# Patient Record
Sex: Female | Born: 1965 | Race: White | Hispanic: No | Smoking: Former smoker
Health system: Southern US, Community
[De-identification: ages and names within clinical notes are randomized; demographics above are authoritative.]

## PROBLEM LIST (undated history)

## (undated) DIAGNOSIS — R591 Generalized enlarged lymph nodes: Secondary | ICD-10-CM

## (undated) DIAGNOSIS — Z8701 Personal history of pneumonia (recurrent): Secondary | ICD-10-CM

## (undated) DIAGNOSIS — D72829 Elevated white blood cell count, unspecified: Secondary | ICD-10-CM

## (undated) HISTORY — DX: Elevated white blood cell count, unspecified: D72.829

## (undated) HISTORY — DX: Generalized enlarged lymph nodes: R59.1

## (undated) HISTORY — PX: OTHER SURGICAL HISTORY: SHX169

## (undated) HISTORY — DX: Personal history of pneumonia (recurrent): Z87.01

---

## 2016-08-17 ENCOUNTER — Emergency Department: Payer: Self-pay

## 2016-08-17 ENCOUNTER — Encounter: Payer: Self-pay | Admitting: Radiology

## 2016-08-17 ENCOUNTER — Inpatient Hospital Stay
Admission: EM | Admit: 2016-08-17 | Discharge: 2016-08-21 | DRG: 853 | Disposition: A | Discharge status: Home / Self Care | Payer: Self-pay | Attending: Internal Medicine | Admitting: Internal Medicine

## 2016-08-17 DIAGNOSIS — D4989 Neoplasm of unspecified behavior of other specified sites: Secondary | ICD-10-CM

## 2016-08-17 DIAGNOSIS — J154 Pneumonia due to other streptococci: Secondary | ICD-10-CM | POA: Diagnosis present

## 2016-08-17 DIAGNOSIS — C8590 Non-Hodgkin lymphoma, unspecified, unspecified site: Secondary | ICD-10-CM | POA: Diagnosis present

## 2016-08-17 DIAGNOSIS — R945 Abnormal results of liver function studies: Secondary | ICD-10-CM | POA: Diagnosis present

## 2016-08-17 DIAGNOSIS — A419 Sepsis, unspecified organism: Principal | ICD-10-CM | POA: Diagnosis present

## 2016-08-17 DIAGNOSIS — R590 Localized enlarged lymph nodes: Secondary | ICD-10-CM | POA: Diagnosis present

## 2016-08-17 DIAGNOSIS — Z87891 Personal history of nicotine dependence: Secondary | ICD-10-CM

## 2016-08-17 DIAGNOSIS — R197 Diarrhea, unspecified: Secondary | ICD-10-CM | POA: Diagnosis present

## 2016-08-17 DIAGNOSIS — E876 Hypokalemia: Secondary | ICD-10-CM | POA: Diagnosis present

## 2016-08-17 DIAGNOSIS — F419 Anxiety disorder, unspecified: Secondary | ICD-10-CM | POA: Diagnosis present

## 2016-08-17 DIAGNOSIS — J181 Lobar pneumonia, unspecified organism: Secondary | ICD-10-CM

## 2016-08-17 DIAGNOSIS — R591 Generalized enlarged lymph nodes: Secondary | ICD-10-CM

## 2016-08-17 DIAGNOSIS — J189 Pneumonia, unspecified organism: Secondary | ICD-10-CM | POA: Diagnosis present

## 2016-08-17 DIAGNOSIS — Z8249 Family history of ischemic heart disease and other diseases of the circulatory system: Secondary | ICD-10-CM

## 2016-08-17 LAB — LACTIC ACID, PLASMA: Lactic Acid, Venous: 1.1 mmol/L (ref 0.5–1.9)

## 2016-08-17 LAB — URINALYSIS, COMPLETE (UACMP) WITH MICROSCOPIC
Bilirubin Urine: NEGATIVE
Glucose, UA: NEGATIVE mg/dL
Hgb urine dipstick: NEGATIVE
KETONES UR: NEGATIVE mg/dL
LEUKOCYTES UA: NEGATIVE
Nitrite: NEGATIVE
PH: 5 (ref 5.0–8.0)
PROTEIN: NEGATIVE mg/dL
Specific Gravity, Urine: 1.01 (ref 1.005–1.030)

## 2016-08-17 LAB — CBC WITH DIFFERENTIAL/PLATELET
BASOS PCT: 0 %
Basophils Absolute: 0.1 10*3/uL (ref 0–0.1)
EOS ABS: 0.1 10*3/uL (ref 0–0.7)
Eosinophils Relative: 0 %
HCT: 34.8 % — ABNORMAL LOW (ref 35.0–47.0)
HEMOGLOBIN: 11.5 g/dL — AB (ref 12.0–16.0)
LYMPHS ABS: 11.3 10*3/uL — AB (ref 1.0–3.6)
Lymphocytes Relative: 35 %
MCH: 28.9 pg (ref 26.0–34.0)
MCHC: 33 g/dL (ref 32.0–36.0)
MCV: 87.5 fL (ref 80.0–100.0)
MONO ABS: 1.5 10*3/uL — AB (ref 0.2–0.9)
MONOS PCT: 5 %
Neutro Abs: 19.4 10*3/uL — ABNORMAL HIGH (ref 1.4–6.5)
Neutrophils Relative %: 60 %
Platelets: 362 10*3/uL (ref 150–440)
RBC: 3.98 MIL/uL (ref 3.80–5.20)
RDW: 13.5 % (ref 11.5–14.5)
WBC: 32.4 10*3/uL — ABNORMAL HIGH (ref 3.6–11.0)

## 2016-08-17 LAB — COMPREHENSIVE METABOLIC PANEL
ALBUMIN: 2.5 g/dL — AB (ref 3.5–5.0)
ALK PHOS: 140 U/L — AB (ref 38–126)
ALT: 66 U/L — AB (ref 14–54)
ANION GAP: 12 (ref 5–15)
AST: 59 U/L — ABNORMAL HIGH (ref 15–41)
BUN: 11 mg/dL (ref 6–20)
CALCIUM: 8.7 mg/dL — AB (ref 8.9–10.3)
CO2: 23 mmol/L (ref 22–32)
CREATININE: 0.71 mg/dL (ref 0.44–1.00)
Chloride: 100 mmol/L — ABNORMAL LOW (ref 101–111)
GFR calc Af Amer: >60 mL/min (ref >60)
GFR calc non Af Amer: >60 mL/min (ref >60)
GLUCOSE: 126 mg/dL — AB (ref 65–99)
Potassium: 2.8 mmol/L — ABNORMAL LOW (ref 3.5–5.1)
Sodium: 135 mmol/L (ref 135–145)
TOTAL PROTEIN: 6.9 g/dL (ref 6.5–8.1)
Total Bilirubin: 0.6 mg/dL (ref 0.3–1.2)

## 2016-08-17 LAB — PROTIME-INR
INR: 1.13
PROTHROMBIN TIME: 14.6 s (ref 11.4–15.2)

## 2016-08-17 MED ORDER — DEXTROSE 5 % IV SOLN
500.0000 mg | INTRAVENOUS | Status: DC
  Administered 2016-08-18 – 2016-08-19 (×2): 500 mg via INTRAVENOUS
  Filled 2016-08-17 (×3): qty 500

## 2016-08-17 MED ORDER — ONDANSETRON HCL 4 MG PO TABS: 4.0000 mg | ORAL_TABLET | Freq: Four times a day (QID) | ORAL | Status: DC | PRN

## 2016-08-17 MED ORDER — ACETAMINOPHEN 650 MG RE SUPP: 650.0000 mg | Freq: Four times a day (QID) | RECTAL | Status: DC | PRN

## 2016-08-17 MED ORDER — SENNOSIDES-DOCUSATE SODIUM 8.6-50 MG PO TABS: 1.0000 | ORAL_TABLET | Freq: Every evening | ORAL | Status: DC | PRN

## 2016-08-17 MED ORDER — CEFTRIAXONE SODIUM-DEXTROSE 1-3.74 GM-% IV SOLR
1.0000 g | INTRAVENOUS | Status: DC
  Administered 2016-08-18: 1 g via INTRAVENOUS
  Filled 2016-08-17: qty 50

## 2016-08-17 MED ORDER — SODIUM CHLORIDE 0.9% FLUSH
3.0000 mL | Freq: Two times a day (BID) | INTRAVENOUS | Status: DC
  Administered 2016-08-18 – 2016-08-20 (×3): 3 mL via INTRAVENOUS

## 2016-08-17 MED ORDER — IOPAMIDOL (ISOVUE-300) INJECTION 61%
100.0000 mL | Freq: Once | INTRAVENOUS | Status: AC | PRN
  Administered 2016-08-17: 100 mL via INTRAVENOUS

## 2016-08-17 MED ORDER — PIPERACILLIN-TAZOBACTAM 3.375 G IVPB 30 MIN
3.3750 g | Freq: Once | INTRAVENOUS | Status: AC
  Administered 2016-08-17: 3.375 g via INTRAVENOUS
  Filled 2016-08-17: qty 50

## 2016-08-17 MED ORDER — DEXTROSE 5 % IV SOLN
500.0000 mg | Freq: Once | INTRAVENOUS | Status: AC
  Administered 2016-08-17: 500 mg via INTRAVENOUS
  Filled 2016-08-17: qty 500

## 2016-08-17 MED ORDER — ENOXAPARIN SODIUM 40 MG/0.4ML ~~LOC~~ SOLN
40.0000 mg | SUBCUTANEOUS | Status: DC
  Administered 2016-08-18: 40 mg via SUBCUTANEOUS
  Filled 2016-08-17: qty 0.4

## 2016-08-17 MED ORDER — ONDANSETRON HCL 4 MG/2ML IJ SOLN: 4.0000 mg | Freq: Four times a day (QID) | INTRAMUSCULAR | Status: DC | PRN

## 2016-08-17 MED ORDER — IOPAMIDOL (ISOVUE-300) INJECTION 61%
30.0000 mL | Freq: Once | INTRAVENOUS | Status: AC | PRN
  Administered 2016-08-17: 30 mL via ORAL

## 2016-08-17 MED ORDER — ONDANSETRON HCL 4 MG/2ML IJ SOLN
4.0000 mg | Freq: Once | INTRAMUSCULAR | Status: AC
  Administered 2016-08-17: 4 mg via INTRAVENOUS
  Filled 2016-08-17: qty 2

## 2016-08-17 MED ORDER — SODIUM CHLORIDE 0.9 % IV BOLUS (SEPSIS)
1000.0000 mL | Freq: Once | INTRAVENOUS | Status: AC
  Administered 2016-08-17: 1000 mL via INTRAVENOUS

## 2016-08-17 MED ORDER — ACETAMINOPHEN 325 MG PO TABS
650.0000 mg | ORAL_TABLET | Freq: Four times a day (QID) | ORAL | Status: DC | PRN
  Administered 2016-08-18: 650 mg via ORAL
  Filled 2016-08-17: qty 2

## 2016-08-17 MED ORDER — POTASSIUM CHLORIDE IN NACL 20-0.9 MEQ/L-% IV SOLN
INTRAVENOUS | Status: DC
  Administered 2016-08-18 – 2016-08-21 (×7): via INTRAVENOUS
  Filled 2016-08-17 (×9): qty 1000

## 2016-08-17 MED ORDER — SODIUM CHLORIDE 0.9 % IV BOLUS (SEPSIS)
2000.0000 mL | Freq: Once | INTRAVENOUS | Status: AC
  Administered 2016-08-17: 2000 mL via INTRAVENOUS

## 2016-08-17 MED ORDER — VANCOMYCIN HCL IN DEXTROSE 1-5 GM/200ML-% IV SOLN
1000.0000 mg | Freq: Once | INTRAVENOUS | Status: AC
  Administered 2016-08-17: 1000 mg via INTRAVENOUS
  Filled 2016-08-17: qty 200

## 2016-08-17 NOTE — H&P (Signed)
[facility] [place] NAME: Sheryl Mitchell    MR#:  [id number]  DATE OF BIRTH:  [birth year]  DATE OF ADMISSION:  08/17/2016  PRIMARY CARE PHYSICIAN: No PCP Per Patient   REQUESTING/REFERRING PHYSICIAN:   CHIEF COMPLAINT:   Chief Complaint  Patient presents with  . Fever    HISTORY OF PRESENT ILLNESS: Sheryl Mitchell  is a 50 y.o. female with no past medical history history presented to the emergency room with fever for the last couple of days. She also has some cough is productive of phlegm. No complaints of any chest pain, shortness of breath. She also had an episode of diarrhea. Patient was evaluated in the emergency room she was found to have pneumonia on x-ray and also she was worked up with CT abdomen because as she had elevated RBC count. Extensive lymphadenopathy noted on the CT abdomen. Hospitalist service was consulted for the care of the patient. No recent travel or sick contacts at home. No headache, dizziness and blurry vision. Patient says she has a lot of weakness and fatigue and also decreased appetite.   PAST MEDICAL HISTORY:  History reviewed. No pertinent past medical history.  PAST SURGICAL HISTORY: Past Surgical History:  Procedure Laterality Date  . none      SOCIAL HISTORY:  Social History  Substance Use Topics  . Smoking status: Former Research scientist (life sciences)  . Smokeless tobacco: Never Used  . Alcohol use 0.6 oz/week    1 Glasses of wine per week    FAMILY HISTORY:  Family History  Problem Relation Age of Onset  . Heart disease Mother   . Cancer Father     DRUG ALLERGIES: No Known Allergies  REVIEW OF SYSTEMS:   CONSTITUTIONAL: Has fever, fatigue and weakness.  EYES: No blurred or double vision.  EARS, NOSE, AND THROAT: No tinnitus or ear pain.  RESPIRATORY: No cough, shortness of breath, wheezing or hemoptysis.  CARDIOVASCULAR: No chest pain, orthopnea, edema.  GASTROINTESTINAL: No nausea, vomiting, one  episode of diarrhea ,mild abdominal pain.  GENITOURINARY: No dysuria, hematuria.  ENDOCRINE: No polyuria, nocturia,  HEMATOLOGY: No anemia, easy bruising or bleeding SKIN: No rash or lesion. MUSCULOSKELETAL: No joint pain or arthritis.   NEUROLOGIC: No tingling, numbness, weakness.  PSYCHIATRY: No anxiety or depression.   MEDICATIONS AT HOME:  Prior to Admission medications   Not on File      PHYSICAL EXAMINATION:   VITAL SIGNS: Blood pressure 116/78, pulse 99, temperature 98.6 F (37 C), temperature source Oral, resp. rate (!) 24, height 5\' 4"  (1.626 m), weight 88.5 kg (195 lb), SpO2 97 %.  GENERAL:  51 y.o.-year-old patient lying in the bed with no acute distress.  EYES: Pupils equal, round, reactive to light and accommodation. No scleral icterus. Extraocular muscles intact.  HEENT: Head atraumatic, normocephalic. Oropharynx and nasopharynx clear.  NECK:  Supple, no jugular venous distention. No thyroid enlargement, no tenderness.  LUNGS: Normal breath sounds bilaterally, no wheezing, rales,rhonchi or crepitation. No use of accessory muscles of respiration.  CARDIOVASCULAR: S1, S2 normal. No murmurs, rubs, or gallops.  ABDOMEN: Soft, mild tenderness around umbilicus, nondistended. Bowel sounds present. No organomegaly or mass.  EXTREMITIES: No pedal edema, cyanosis, or clubbing.  NEUROLOGIC: Cranial nerves II through XII are intact. Muscle strength 5/5 in all extremities. Sensation intact. Gait not checked.  PSYCHIATRIC: The patient is alert and oriented x 3.  SKIN: No obvious rash, lesion, or ulcer.   LABORATORY PANEL:  CBC  Recent Labs Lab 08/17/16 1822  WBC 32.4*  HGB 11.5*  HCT 34.8*  PLT 362  MCV 87.5  MCH 28.9  MCHC 33.0  RDW 13.5  LYMPHSABS 11.3*  MONOABS 1.5*  EOSABS 0.1  BASOSABS 0.1   ------------------------------------------------------------------------------------------------------------------  Chemistries   Recent Labs Lab 08/17/16 1822  NA  135  K 2.8*  CL 100*  CO2 23  GLUCOSE 126*  BUN 11  CREATININE 0.71  CALCIUM 8.7*  AST 59*  ALT 66*  ALKPHOS 140*  BILITOT 0.6   ------------------------------------------------------------------------------------------------------------------ estimated creatinine clearance is 87.6 mL/min (by C-G formula based on SCr of 0.71 mg/dL). ------------------------------------------------------------------------------------------------------------------ No results for input(s): TSH, T4TOTAL, T3FREE, THYROIDAB in the last 72 hours.  Invalid input(s): FREET3   Coagulation profile  Recent Labs Lab 08/17/16 1822  INR 1.13   ------------------------------------------------------------------------------------------------------------------- No results for input(s): DDIMER in the last 72 hours. -------------------------------------------------------------------------------------------------------------------  Cardiac Enzymes No results for input(s): CKMB, TROPONINI, MYOGLOBIN in the last 168 hours.  Invalid input(s): CK ------------------------------------------------------------------------------------------------------------------ Invalid input(s): POCBNP  ---------------------------------------------------------------------------------------------------------------  Urinalysis    Component Value Date/Time   COLORURINE YELLOW (A) 08/17/2016 1822   APPEARANCEUR CLEAR (A) 08/17/2016 1822   LABSPEC 1.010 08/17/2016 1822   PHURINE 5.0 08/17/2016 1822   GLUCOSEU NEGATIVE 08/17/2016 1822   HGBUR NEGATIVE 08/17/2016 1822   BILIRUBINUR NEGATIVE 08/17/2016 1822   KETONESUR NEGATIVE 08/17/2016 1822   PROTEINUR NEGATIVE 08/17/2016 1822   NITRITE NEGATIVE 08/17/2016 1822   LEUKOCYTESUR NEGATIVE 08/17/2016 1822     RADIOLOGY: Dg Chest 2 View  Result Date: 08/17/2016 CLINICAL DATA:  Fever and cough EXAM: CHEST  2 VIEW COMPARISON:  None. FINDINGS: Cardiac shadow is within normal  limits. Right upper lobe infiltrate is seen. The lungs are otherwise clear. No pneumothorax or effusion is noted. No bony abnormality is seen. IMPRESSION: Right upper lobe pneumonia. Followup PA and lateral chest X-ray is recommended in 3-4 weeks following trial of antibiotic therapy to ensure resolution and exclude underlying malignancy. Electronically Signed   By: Inez Catalina M.D.   On: 08/17/2016 19:24   Ct Abdomen Pelvis W Contrast  Result Date: 08/17/2016 CLINICAL DATA:  Fever, diarrhea, fatigue and anorexia for 6 days. EXAM: CT ABDOMEN AND PELVIS WITH CONTRAST TECHNIQUE: Multidetector CT imaging of the abdomen and pelvis was performed using the standard protocol following bolus administration of intravenous contrast. CONTRAST:  171mL ISOVUE-300 IOPAMIDOL (ISOVUE-300) INJECTION 61% COMPARISON:  None. FINDINGS: Lower chest: Consolidation in the right middle lobe base, continuing above the upper limit of this study. Inferior mediastinal adenopathy measuring at least 2 cm short axis just to the right of the distal thoracic esophagus. Hepatobiliary: No focal liver abnormality is seen. No gallstones, gallbladder wall thickening, or biliary dilatation. Pancreas: Unremarkable. No pancreatic ductal dilatation or surrounding inflammatory changes. Spleen: No focal splenic lesions. The spleen is mildly enlarged, measuring 9.0 by 1.5 x 1.4 cm. Adrenals/Urinary Tract: Adrenal glands are unremarkable. Kidneys are normal, without renal calculi, focal lesion, or hydronephrosis. Bladder is unremarkable. Stomach/Bowel: Stomach is within normal limits. Appendix appears normal. No evidence of bowel wall thickening, distention, or inflammatory changes. Vascular/Lymphatic: The abdominal aorta is normal in caliber with mild atherosclerotic calcification. There is extensive adenopathy, with greatest involvement in the retroperitoneum. There is a 10 mm short axis aortocaval node on series 2, image 32 and a 1.4 cm celiac node to  the left of midline on series 2, image 32. There is a 12 mm retrocaval node on series 2 image 48. There are  multiple para-aortic nodes measuring up to 1.2 cm. There is a left common iliac node measuring 1.3 cm on series 2, image 60. There is a 1.5 cm node adjacent to the left iliac bifurcation on series 2, image 66. There are multiple iliac nodes throughout the pelvis. There are bilateral obturator internus nodes measuring up to 1.6 cm on series 2, image 80. Reproductive: Uterus and bilateral adnexa are unremarkable. Other: No focal inflammation.  No ascites. Musculoskeletal: No acute or significant osseous findings. IMPRESSION: 1. Extensive adenopathy throughout the abdomen and pelvis. This is concerning for a neoplastic process such as lymphoma. 2. Mild splenomegaly.  No focal splenic lesion. 3. Included portions of the lower chest demonstrate inferior mediastinal adenopathy and right middle lobe base lung consolidation. Consider chest CT for evaluation of extent of disease. 4. Consider tissue sampling for diagnosis. If there are no palpable nodes, many of the abdomen/pelvic nodes are amenable to CT-guided biopsy. 5. These results will be called to the ordering clinician or representative by the Radiologist Assistant, and communication documented in the PACS or zVision Dashboard. Electronically Signed   By: Andreas Newport M.D.   On: 08/17/2016 21:28    EKG: No orders found for this or any previous visit.  IMPRESSION AND PLAN: 51 year old female patient with no past medical history presented to the emergency room with fever, abdominal discomfort and cough. Admitting diagnosis 1. Community-acquired pneumonia 2. Extensive abdominal lymphadenopathy 3. Leukocytosis 4. Hypokalemia 5. Abnormal liver function tests Treatment plan Admit patient to medical floor IV fluid hydration Start patient on IV Rocephin and IV Zithromax antibiotic Oncology consultation for evaluation of abdominal  lymphadenopathy Replace potassium intravenously Follow-up WBC count   All the records are reviewed and case discussed with ED provider. Management plans discussed with the patient, family and they are in agreement.  CODE STATUS:FULL CODE    Code Status Orders        Start     Ordered   08/17/16 2313  Full code  Continuous     08/17/16 2312    Code Status History    Date Active Date Inactive Code Status Order ID Comments User Context   This patient has a current code status but no historical code status.       TOTAL TIME TAKING CARE OF THIS PATIENT: 50 minutes.    Saundra Shelling M.D on 08/17/2016 at 11:37 PM  Between 7am to 6pm - Pager - [phone]  After 6pm go to [email or web address] - password [email or web address] [facility]  [facility]  Office  [phone]  CC: Primary care physician; No PCP Per Patient

## 2016-08-17 NOTE — ED Notes (Signed)
Pt transport to 103 

## 2016-08-17 NOTE — ED Provider Notes (Signed)
[facility] Emergency Department Provider Note  ____________________________________________  Time seen: Approximately 7:03 PM  I have reviewed the triage vital signs and the nursing notes.   HISTORY  Chief Complaint Fever    HPI Sheryl Mitchell is a 51 y.o. female who complains of gradual onset mild generalized abdominal discomfort over the past week. This is been constant. No aggravating or alleviating factors. Associated with fever or chills and diarrhea. She has lost her appetite as well and feels like she is probably dehydrated. No dizziness. No chest pain or shortness of breath. Never had anything like this before. A coworker was recently sick with same symptoms this week.   History reviewed. No pertinent past medical history. None  There are no active problems to display for this patient.    No past surgical history on file. None  Prior to Admission medications   Not on File  None   Allergies Patient has no known allergies.   No family history on file.  Social History Social History  Substance Use Topics  . Smoking status: Not on file  . Smokeless tobacco: Not on file  . Alcohol use Not on file  No tobacco or alcohol use  Review of Systems  Constitutional:   Positive fever and chills.  ENT:   No sore throat. No rhinorrhea. Lymphatic: No swollen glands, No extremity swelling Endocrine: No hot/cold flashes. No significant weight change. No neck swelling. Cardiovascular:   No chest pain or syncope. Respiratory:   No dyspnea positive nonproductive cough. Gastrointestinal:   Positive abdominal discomfort, positive diarrhea. No vomiting.  Genitourinary:   Negative for dysuria. Positive for decreased urine output. Musculoskeletal:   Negative for focal pain or swelling Neurological:   Negative for headaches or weakness. All other systems reviewed and are negative except as documented above in ROS and  HPI.  ____________________________________________   PHYSICAL EXAM:  VITAL SIGNS: ED Triage Vitals  Enc Vitals Group     BP 08/17/16 1807 (!) 149/90     Pulse Rate 08/17/16 1807 (!) 127     Resp 08/17/16 1807 20     Temp 08/17/16 1807 (!) 100.5 F (38.1 C)     Temp Source 08/17/16 1807 Oral     SpO2 08/17/16 1807 94 %     Weight 08/17/16 1808 195 lb (88.5 kg)     Height 08/17/16 1808 5\' 4"  (1.626 m)     Head Circumference --      Peak Flow --      Pain Score --      Pain Loc --      Pain Edu? --      Excl. in [place]? --     Vital signs reviewed, nursing assessments reviewed.   Constitutional:   Alert and oriented. Not in distress. Eyes:   No scleral icterus. No conjunctival pallor. PERRL. EOMI.  No nystagmus. ENT   Head:   Normocephalic and atraumatic.   Nose:   No congestion/rhinnorhea. No septal hematoma   Mouth/Throat:   Dry mucous membranes, mild pharyngeal erythema. No peritonsillar mass.    Neck:   No stridor. No SubQ emphysema. No meningismus. Hematological/Lymphatic/Immunilogical:   No cervical lymphadenopathy. Cardiovascular:   Tachycardia heart rate 120. Symmetric bilateral radial and DP pulses.  No murmurs.  Respiratory:   Normal respiratory effort without tachypnea nor retractions. Breath sounds are clear and equal bilaterally. No wheezes/rales/rhonchi. Gastrointestinal:   Soft and nontender. Non distended. There is no CVA tenderness.  No rebound,  rigidity, or guarding. Genitourinary:   deferred Musculoskeletal:   Normal range of motion in all extremities. No joint effusions.  No lower extremity tenderness.  No edema. Neurologic:   Normal speech and language.  CN 2-10 normal. Motor grossly intact. No gross focal neurologic deficits are appreciated.  Skin:    Skin is warm, dry and intact. No rash noted.  No petechiae, purpura, or bullae.  ____________________________________________    LABS (pertinent positives/negatives) (all labs ordered are  listed, but only abnormal results are displayed) Labs Reviewed  COMPREHENSIVE METABOLIC PANEL - Abnormal; Notable for the following:       Result Value   Potassium 2.8 (*)    Chloride 100 (*)    Glucose, Bld 126 (*)    Calcium 8.7 (*)    Albumin 2.5 (*)    AST 59 (*)    ALT 66 (*)    Alkaline Phosphatase 140 (*)    All other components within normal limits  CBC WITH DIFFERENTIAL/PLATELET - Abnormal; Notable for the following:    WBC 32.4 (*)    Hemoglobin 11.5 (*)    HCT 34.8 (*)    Neutro Abs 19.4 (*)    Lymphs Abs 11.3 (*)    Monocytes Absolute 1.5 (*)    All other components within normal limits  URINALYSIS, COMPLETE (UACMP) WITH MICROSCOPIC - Abnormal; Notable for the following:    Color, Urine YELLOW (*)    APPearance CLEAR (*)    Bacteria, UA FEW (*)    Squamous Epithelial / LPF 0-5 (*)    All other components within normal limits  CULTURE, BLOOD (ROUTINE X 2)  CULTURE, BLOOD (ROUTINE X 2)  LACTIC ACID, PLASMA  PROTIME-INR   ____________________________________________   EKG    ____________________________________________    RADIOLOGY  Dg Chest 2 View  Result Date: 08/17/2016 CLINICAL DATA:  Fever and cough EXAM: CHEST  2 VIEW COMPARISON:  None. FINDINGS: Cardiac shadow is within normal limits. Right upper lobe infiltrate is seen. The lungs are otherwise clear. No pneumothorax or effusion is noted. No bony abnormality is seen. IMPRESSION: Right upper lobe pneumonia. Followup PA and lateral chest X-ray is recommended in 3-4 weeks following trial of antibiotic therapy to ensure resolution and exclude underlying malignancy. Electronically Signed   By: Inez Catalina M.D.   On: 08/17/2016 19:24   Ct Abdomen Pelvis W Contrast  Result Date: 08/17/2016 CLINICAL DATA:  Fever, diarrhea, fatigue and anorexia for 6 days. EXAM: CT ABDOMEN AND PELVIS WITH CONTRAST TECHNIQUE: Multidetector CT imaging of the abdomen and pelvis was performed using the standard protocol following  bolus administration of intravenous contrast. CONTRAST:  157mL ISOVUE-300 IOPAMIDOL (ISOVUE-300) INJECTION 61% COMPARISON:  None. FINDINGS: Lower chest: Consolidation in the right middle lobe base, continuing above the upper limit of this study. Inferior mediastinal adenopathy measuring at least 2 cm short axis just to the right of the distal thoracic esophagus. Hepatobiliary: No focal liver abnormality is seen. No gallstones, gallbladder wall thickening, or biliary dilatation. Pancreas: Unremarkable. No pancreatic ductal dilatation or surrounding inflammatory changes. Spleen: No focal splenic lesions. The spleen is mildly enlarged, measuring 9.0 by 1.5 x 1.4 cm. Adrenals/Urinary Tract: Adrenal glands are unremarkable. Kidneys are normal, without renal calculi, focal lesion, or hydronephrosis. Bladder is unremarkable. Stomach/Bowel: Stomach is within normal limits. Appendix appears normal. No evidence of bowel wall thickening, distention, or inflammatory changes. Vascular/Lymphatic: The abdominal aorta is normal in caliber with mild atherosclerotic calcification. There is extensive adenopathy, with greatest involvement in  the retroperitoneum. There is a 10 mm short axis aortocaval node on series 2, image 32 and a 1.4 cm celiac node to the left of midline on series 2, image 32. There is a 12 mm retrocaval node on series 2 image 48. There are multiple para-aortic nodes measuring up to 1.2 cm. There is a left common iliac node measuring 1.3 cm on series 2, image 60. There is a 1.5 cm node adjacent to the left iliac bifurcation on series 2, image 66. There are multiple iliac nodes throughout the pelvis. There are bilateral obturator internus nodes measuring up to 1.6 cm on series 2, image 80. Reproductive: Uterus and bilateral adnexa are unremarkable. Other: No focal inflammation.  No ascites. Musculoskeletal: No acute or significant osseous findings. IMPRESSION: 1. Extensive adenopathy throughout the abdomen and  pelvis. This is concerning for a neoplastic process such as lymphoma. 2. Mild splenomegaly.  No focal splenic lesion. 3. Included portions of the lower chest demonstrate inferior mediastinal adenopathy and right middle lobe base lung consolidation. Consider chest CT for evaluation of extent of disease. 4. Consider tissue sampling for diagnosis. If there are no palpable nodes, many of the abdomen/pelvic nodes are amenable to CT-guided biopsy. 5. These results will be called to the ordering clinician or representative by the Radiologist Assistant, and communication documented in the PACS or zVision Dashboard. Electronically Signed   By: Andreas Newport M.D.   On: 08/17/2016 21:28    ____________________________________________   PROCEDURES Procedures  ____________________________________________   INITIAL IMPRESSION / ASSESSMENT AND PLAN / ED COURSE  Pertinent labs & imaging results that were available during my care of the patient were reviewed by me and considered in my medical decision making (see chart for details).  Patient presents with fever and abdominal discomfort and diarrhea. Found to have fever and tachycardia on vital signs. Exam is unremarkable actually. This may be viral gastroenteritis, will workup with sepsis labs, if any significant abnormalities patient may need imaging of the abdomen to look for any occult pathology that is not clinically apparent.  Clinical Course as of Aug 18 2227  Sun Aug 17, 2016  1903 Check CT a/p. Zosyn.  WBC: (!) 32.4 [PS]  1952 CXR c/w RUL pna. Will broaden abx. Will p lan to admit given severity of presentation.   [PS]  2159 D/w Dr. Rogue Bussing. Diff. Not c/w blast crisis. Appropriate to admit to [facility] for PNA/sepsis tx and heme/onc consult in AM.  [PS]  2227 Along discussion with the patient regarding the findings and concerns for possible malignancy. Discussed with hospitalist for further management.       [PS]    Clinical Course User  Index [PS] Carrie Mew, MD     ----------------------------------------- 10:29 PM on 08/17/2016 -----------------------------------------  Sepsis - Repeat Assessment  Performed at:    10:20 PM  Vitals     Blood pressure (!) 141/80, pulse (!) 109, temperature (!) 100.5 F (38.1 C), temperature source Oral, resp. rate (!) 25, height 5\' 4"  (1.626 m), weight 195 lb (88.5 kg), SpO2 98 %.  Heart:     Regular rate and rhythm  Lungs:    CTA  Capillary Refill:   <2 sec  Peripheral Pulse:   Radial pulse palpable  Skin:     Normal Color     ____________________________________________   FINAL CLINICAL IMPRESSION(S) / ED DIAGNOSES  Final diagnoses:  Pneumonia of right upper lobe due to infectious organism ([place])  Sepsis, due to unspecified organism [facility])  Lymphadenopathy  New Prescriptions   No medications on file     Portions of this note were generated with dragon dictation software. Dictation errors may occur despite best attempts at proofreading.    Carrie Mew, MD 08/17/16 2230

## 2016-08-17 NOTE — Progress Notes (Addendum)
Pharmacy Antibiotic Note  Sheryl Mitchell is a 51 y.o. female admitted on 08/17/2016 with pneumonia.  Pharmacy has been consulted for Ceftriaxone dosing.  Plan: WIll initiate Ceftriaxone 2 g IV daily   Height: 5\' 4"  (162.6 cm) Weight: 195 lb (88.5 kg) IBW/kg (Calculated) : 54.7  Temp (24hrs), Avg:99.7 F (37.6 C), Min:98.6 F (37 C), Max:100.5 F (38.1 C)   Recent Labs Lab 08/17/16 1822  WBC 32.4*  CREATININE 0.71  LATICACIDVEN 1.1    Estimated Creatinine Clearance: 87.6 mL/min (by C-G formula based on SCr of 0.71 mg/dL).    No Known Allergies   Thank you for allowing pharmacy to be a part of this patient's care.  Tobie Lords, PharmD, BCPS Clinical Pharmacist 08/17/2016

## 2016-08-17 NOTE — ED Triage Notes (Signed)
Pt reports that she started feeling bad on Monday, fever, diarrhea, and cough, pt denies pain, pt states that she isn't getting better and feels the worst she's ever felt in her life, denies hx of diverticulitis or pneumonia, denies pain

## 2016-08-18 ENCOUNTER — Inpatient Hospital Stay: Payer: Self-pay

## 2016-08-18 DIAGNOSIS — J189 Pneumonia, unspecified organism: Secondary | ICD-10-CM

## 2016-08-18 DIAGNOSIS — D72829 Elevated white blood cell count, unspecified: Secondary | ICD-10-CM

## 2016-08-18 DIAGNOSIS — R7989 Other specified abnormal findings of blood chemistry: Secondary | ICD-10-CM

## 2016-08-18 DIAGNOSIS — R197 Diarrhea, unspecified: Secondary | ICD-10-CM

## 2016-08-18 DIAGNOSIS — Z79899 Other long term (current) drug therapy: Secondary | ICD-10-CM

## 2016-08-18 DIAGNOSIS — R110 Nausea: Secondary | ICD-10-CM

## 2016-08-18 DIAGNOSIS — R591 Generalized enlarged lymph nodes: Secondary | ICD-10-CM

## 2016-08-18 DIAGNOSIS — Z87891 Personal history of nicotine dependence: Secondary | ICD-10-CM

## 2016-08-18 DIAGNOSIS — R05 Cough: Secondary | ICD-10-CM

## 2016-08-18 LAB — BLOOD CULTURE ID PANEL (REFLEXED)
Acinetobacter baumannii: NOT DETECTED
CANDIDA KRUSEI: NOT DETECTED
CANDIDA PARAPSILOSIS: NOT DETECTED
Candida albicans: NOT DETECTED
Candida glabrata: NOT DETECTED
Candida tropicalis: NOT DETECTED
ESCHERICHIA COLI: NOT DETECTED
Enterobacter cloacae complex: NOT DETECTED
Enterobacteriaceae species: NOT DETECTED
Enterococcus species: NOT DETECTED
Haemophilus influenzae: NOT DETECTED
KLEBSIELLA OXYTOCA: NOT DETECTED
Klebsiella pneumoniae: NOT DETECTED
Listeria monocytogenes: NOT DETECTED
Neisseria meningitidis: NOT DETECTED
PROTEUS SPECIES: NOT DETECTED
Pseudomonas aeruginosa: NOT DETECTED
SERRATIA MARCESCENS: NOT DETECTED
STAPHYLOCOCCUS AUREUS BCID: NOT DETECTED
STAPHYLOCOCCUS SPECIES: NOT DETECTED
STREPTOCOCCUS PYOGENES: NOT DETECTED
Streptococcus agalactiae: NOT DETECTED
Streptococcus pneumoniae: DETECTED — AB
Streptococcus species: DETECTED — AB

## 2016-08-18 LAB — GASTROINTESTINAL PANEL BY PCR, STOOL (REPLACES STOOL CULTURE)
ASTROVIRUS: NOT DETECTED
Adenovirus F40/41: NOT DETECTED
CAMPYLOBACTER SPECIES: NOT DETECTED
Cryptosporidium: NOT DETECTED
Cyclospora cayetanensis: NOT DETECTED
ENTEROTOXIGENIC E COLI (ETEC): NOT DETECTED
Entamoeba histolytica: NOT DETECTED
Enteroaggregative E coli (EAEC): NOT DETECTED
Enteropathogenic E coli (EPEC): NOT DETECTED
Giardia lamblia: NOT DETECTED
NOROVIRUS GI/GII: NOT DETECTED
PLESIMONAS SHIGELLOIDES: NOT DETECTED
ROTAVIRUS A: NOT DETECTED
SAPOVIRUS (I, II, IV, AND V): NOT DETECTED
Salmonella species: NOT DETECTED
Shiga like toxin producing E coli (STEC): NOT DETECTED
Shigella/Enteroinvasive E coli (EIEC): NOT DETECTED
Vibrio cholerae: NOT DETECTED
Vibrio species: NOT DETECTED
Yersinia enterocolitica: NOT DETECTED

## 2016-08-18 LAB — CBC
HCT: 29.3 % — ABNORMAL LOW (ref 35.0–47.0)
Hemoglobin: 9.7 g/dL — ABNORMAL LOW (ref 12.0–16.0)
MCH: 29.7 pg (ref 26.0–34.0)
MCHC: 33.1 g/dL (ref 32.0–36.0)
MCV: 89.8 fL (ref 80.0–100.0)
PLATELETS: 257 10*3/uL (ref 150–440)
RBC: 3.26 MIL/uL — AB (ref 3.80–5.20)
RDW: 13.1 % (ref 11.5–14.5)
WBC: 22.3 10*3/uL — AB (ref 3.6–11.0)

## 2016-08-18 LAB — BASIC METABOLIC PANEL
Anion gap: 7 (ref 5–15)
Anion gap: 5 (ref 5–15)
BUN: 8 mg/dL (ref 6–20)
BUN: 7 mg/dL (ref 6–20)
CALCIUM: 7.5 mg/dL — AB (ref 8.9–10.3)
CALCIUM: 8.1 mg/dL — AB (ref 8.9–10.3)
CO2: 24 mmol/L (ref 22–32)
CO2: 24 mmol/L (ref 22–32)
CREATININE: 0.76 mg/dL (ref 0.44–1.00)
CREATININE: 0.7 mg/dL (ref 0.44–1.00)
Chloride: 108 mmol/L (ref 101–111)
Chloride: 111 mmol/L (ref 101–111)
GFR calc Af Amer: >60 mL/min (ref >60)
GFR calc non Af Amer: >60 mL/min (ref >60)
GFR calc non Af Amer: >60 mL/min (ref >60)
GLUCOSE: 107 mg/dL — AB (ref 65–99)
Glucose, Bld: 97 mg/dL (ref 65–99)
Potassium: 2.9 mmol/L — ABNORMAL LOW (ref 3.5–5.1)
Potassium: 4.4 mmol/L (ref 3.5–5.1)
SODIUM: 139 mmol/L (ref 135–145)
Sodium: 140 mmol/L (ref 135–145)

## 2016-08-18 LAB — HEPATIC FUNCTION PANEL
ALT: 47 U/L (ref 14–54)
AST: 33 U/L (ref 15–41)
Albumin: 2 g/dL — ABNORMAL LOW (ref 3.5–5.0)
Alkaline Phosphatase: 113 U/L (ref 38–126)
BILIRUBIN DIRECT: 0.2 mg/dL (ref 0.1–0.5)
BILIRUBIN INDIRECT: 0.1 mg/dL — AB (ref 0.3–0.9)
Total Bilirubin: 0.3 mg/dL (ref 0.3–1.2)
Total Protein: 5.3 g/dL — ABNORMAL LOW (ref 6.5–8.1)

## 2016-08-18 LAB — C DIFFICILE QUICK SCREEN W PCR REFLEX
C DIFFICILE (CDIFF) INTERP: NOT DETECTED
C DIFFICLE (CDIFF) ANTIGEN: NEGATIVE
C Diff toxin: NEGATIVE

## 2016-08-18 LAB — LACTATE DEHYDROGENASE: LDH: 156 U/L (ref 98–192)

## 2016-08-18 LAB — MAGNESIUM: MAGNESIUM: 2 mg/dL (ref 1.7–2.4)

## 2016-08-18 LAB — PHOSPHORUS: Phosphorus: 3.6 mg/dL (ref 2.5–4.6)

## 2016-08-18 LAB — PATHOLOGIST SMEAR REVIEW

## 2016-08-18 MED ORDER — DEXTROSE 5 % IV SOLN
2.0000 g | INTRAVENOUS | Status: DC
  Administered 2016-08-19 – 2016-08-21 (×3): 2 g via INTRAVENOUS
  Filled 2016-08-18 (×5): qty 2

## 2016-08-18 MED ORDER — SODIUM CHLORIDE 0.9 % IV SOLN
30.0000 meq | INTRAVENOUS | Status: AC
  Administered 2016-08-18 (×2): 30 meq via INTRAVENOUS
  Filled 2016-08-18 (×3): qty 15

## 2016-08-18 MED ORDER — IOPAMIDOL (ISOVUE-300) INJECTION 61%
75.0000 mL | Freq: Once | INTRAVENOUS | Status: AC | PRN
  Administered 2016-08-18: 75 mL via INTRAVENOUS

## 2016-08-18 MED ORDER — GUAIFENESIN-DM 100-10 MG/5ML PO SYRP
5.0000 mL | ORAL_SOLUTION | ORAL | Status: DC | PRN
  Administered 2016-08-18 (×2): 5 mL via ORAL
  Filled 2016-08-18 (×2): qty 5

## 2016-08-18 MED ORDER — ENOXAPARIN SODIUM 40 MG/0.4ML ~~LOC~~ SOLN
40.0000 mg | SUBCUTANEOUS | Status: DC
  Administered 2016-08-19 – 2016-08-20 (×2): 40 mg via SUBCUTANEOUS
  Filled 2016-08-18 (×2): qty 0.4

## 2016-08-18 MED ORDER — ENSURE ENLIVE PO LIQD
237.0000 mL | ORAL | Status: DC
  Administered 2016-08-18 – 2016-08-21 (×4): 237 mL via ORAL

## 2016-08-18 MED ORDER — DEXTROSE 5 % IV SOLN
1.0000 g | Freq: Once | INTRAVENOUS | Status: AC
  Administered 2016-08-18: 1 g via INTRAVENOUS
  Filled 2016-08-18: qty 10

## 2016-08-18 NOTE — Progress Notes (Signed)
[name] responded to an OR for Major Life transitions.  Pt was awake and alert. Pt confided that she has experienced major transitions over the past two years: Husband passed, moved from home in [facility] [place] [facility] to [facility] for a change. Started working but had issues with a co-worker and left the job. Has very little support. Is having financial issues. Pt feels that everyone else is being blessed and that God Is "Skipping over her".  Big question... WHY? Sheryl Mitchell spent some time exploring her definition of blessing. Pt began to discover some blessing she had overlooked because she was looking another direction. Pt requested prayer for guidance, which was provided. Pt stated she was really glad we had a chat. CH is available for follow up as needed.    08/18/16 1100  Clinical Encounter Type  Visited With Patient;Health care provider  Visit Type Initial;Spiritual support  Referral From Nurse  Spiritual Encounters  Spiritual Needs Prayer;Emotional  Stress Factors  Patient Stress Factors Financial concerns;Health changes;Major life changes

## 2016-08-18 NOTE — Consult Note (Signed)
[facility] CONSULT NOTE  Patient Care Team: No Pcp Per Patient as PCP - General (General Practice)  CHIEF COMPLAINTS/PURPOSE OF CONSULTATION:  Lymphadenopathy.  HISTORY OF PRESENTING ILLNESS:  Sheryl Mitchell 51 y.o.  female no significant past medical history noted to have cough fever productive sputum over the last 7-10 days. Patient admits to episodes of sweating over the last few days. Lost about 5-10 pounds in the last 1 week or so. Complains of severe fatigue. Intermittent nausea. No vomiting. Complains of diarrhea 3-4 loose stools a day.  Patient denies any previous significant weight loss. No skin rash. No headaches.  ROS: A complete 10 point review of system is done which is negative except mentioned above in history of present illness  MEDICAL HISTORY:  History reviewed. No pertinent past medical history.  SURGICAL HISTORY: Past Surgical History:  Procedure Laterality Date  . none      SOCIAL HISTORY: Patient lives in [place] by herself. She has no children. Her husband passed away 2 years ago. History of smoking quit approximately 8 years ago. Occasional alcohol. She currently works at a Engineer, manufacturing. She is otherwise fairly independent a daily activities Social History   Social History  . Marital status: Widowed    Spouse name: N/A  . Number of children: N/A  . Years of education: N/A   Occupational History  .  [employer]   Social History Main Topics  . Smoking status: Former Research scientist (life sciences)  . Smokeless tobacco: Never Used  . Alcohol use 0.6 oz/week    1 Glasses of wine per week  . Drug use: No  . Sexual activity: Yes   Other Topics Concern  . Not on file   Social History Narrative  . No narrative on file    FAMILY HISTORY: Family History  Problem Relation Age of Onset  . Heart disease Mother   . Cancer Father     ALLERGIES:  has No Known Allergies.  MEDICATIONS:  Current Facility-Administered Medications  Medication Dose Route  Frequency Provider Last Rate Last Dose  . 0.9 % NaCl with KCl 20 mEq/ L  infusion   Intravenous Continuous Saundra Shelling, MD 75 mL/hr at 08/18/16 1023    . acetaminophen (TYLENOL) tablet 650 mg  650 mg Oral Q6H PRN Saundra Shelling, MD   650 mg at 08/18/16 0017   Or  . acetaminophen (TYLENOL) suppository 650 mg  650 mg Rectal Q6H PRN Saundra Shelling, MD      . azithromycin (ZITHROMAX) 500 mg in dextrose 5 % 250 mL IVPB  500 mg Intravenous Q24H Saundra Shelling, MD      . Derrill Memo ON 08/19/2016] cefTRIAXone (ROCEPHIN) 2 g in dextrose 5 % 50 mL IVPB  2 g Intravenous Q24H Max Sane, MD      . Derrill Memo ON 08/19/2016] enoxaparin (LOVENOX) injection 40 mg  40 mg Subcutaneous Q24H Ascencion Dike, PA-C      . feeding supplement (ENSURE ENLIVE) (ENSURE ENLIVE) liquid 237 mL  237 mL Oral Q24H Max Sane, MD   237 mL at 08/18/16 1158  . guaiFENesin-dextromethorphan (ROBITUSSIN DM) 100-10 MG/5ML syrup 5 mL  5 mL Oral Q4H PRN Saundra Shelling, MD   5 mL at 08/18/16 1022  . ondansetron (ZOFRAN) tablet 4 mg  4 mg Oral Q6H PRN Saundra Shelling, MD       Or  . ondansetron (ZOFRAN) injection 4 mg  4 mg Intravenous Q6H PRN Saundra Shelling, MD      . potassium chloride  30 mEq in sodium chloride 0.9 % 265 mL (KCL MULTIRUN) IVPB  30 mEq Intravenous Q4H Vipul Shah, MD 88.3 mL/hr at 08/18/16 1500 30 mEq at 08/18/16 1500  . senna-docusate (Senokot-S) tablet 1 tablet  1 tablet Oral QHS PRN Pavan Pyreddy, MD      . sodium chloride flush (NS) 0.9 % injection 3 mL  3 mL Intravenous Q12H Pavan Pyreddy, MD          .  PHYSICAL EXAMINATION:  Vitals:   08/18/16 0342 08/18/16 1249  BP: (!) 106/51 126/67  Pulse: 86 92  Resp: 20 18  Temp: 98 F (36.7 C) 98.9 F (37.2 C)   Filed Weights   08/17/16 1808 08/17/16 2338  Weight: 195 lb (88.5 kg) 206 lb 8 oz (93.7 kg)    GENERAL: Well-nourished well-developed; Alert, no distress and comfortable.   Obese. EYES: no pallor or icterus OROPHARYNX: no thrush or ulceration. NECK: supple, no masses  felt LYMPH:  no palpable lymphadenopathy in the axillary or inguinal regions. Mild shotty lymph nodes noted in the neck bilaterally. LUNGS: decreased breath sounds to auscultation at bases and  No wheeze or crackles HEART/CVS: regular rate & rhythm and no murmurs; No lower extremity edema ABDOMEN: abdomen soft, non-tender and normal bowel sounds Musculoskeletal:no cyanosis of digits and no clubbing  PSYCH: alert & oriented x 3 with fluent speech NEURO: no focal motor/sensory deficits SKIN:  no rashes or significant lesions  LABORATORY DATA:  I have reviewed the data as listed Lab Results  Component Value Date   WBC 22.3 (H) 08/18/2016   HGB 9.7 (L) 08/18/2016   HCT 29.3 (L) 08/18/2016   MCV 89.8 08/18/2016   PLT 257 08/18/2016    Recent Labs  08/17/16 1822 08/18/16 0454  NA 135 139  K 2.8* 2.9*  CL 100* 108  CO2 23 24  GLUCOSE 126* 97  BUN 11 8  CREATININE 0.71 0.76  CALCIUM 8.7* 7.5*  GFRNONAA >60 >60  GFRAA >60 >60  PROT 6.9 5.3*  ALBUMIN 2.5* 2.0*  AST 59* 33  ALT 66* 47  ALKPHOS 140* 113  BILITOT 0.6 0.3  BILIDIR  --  0.2  IBILI  --  0.1*    RADIOGRAPHIC STUDIES: I have personally reviewed the radiological images as listed and agreed with the findings in the report. Dg Chest 2 View  Result Date: 08/17/2016 CLINICAL DATA:  Fever and cough EXAM: CHEST  2 VIEW COMPARISON:  None. FINDINGS: Cardiac shadow is within normal limits. Right upper lobe infiltrate is seen. The lungs are otherwise clear. No pneumothorax or effusion is noted. No bony abnormality is seen. IMPRESSION: Right upper lobe pneumonia. Followup PA and lateral chest X-ray is recommended in 3-4 weeks following trial of antibiotic therapy to ensure resolution and exclude underlying malignancy. Electronically Signed   By: Inez Catalina M.D.   On: 08/17/2016 19:24   Ct Soft Tissue Neck W Contrast  Result Date: 08/18/2016 CLINICAL DATA:  51 year old female with fever and abdominal pain. Extensive abdominal  and pelvis lymphadenopathy suspicious for lymphoproliferative disorder or other neoplastic process. EXAM: CT NECK WITH CONTRAST TECHNIQUE: Multidetector CT imaging of the neck was performed using the standard protocol following the bolus administration of intravenous contrast. CONTRAST:  90mL ISOVUE-300 IOPAMIDOL (ISOVUE-300) INJECTION 61% in conjunction with contrast enhanced imaging of the chest reported separately. COMPARISON:  Chest CT today reported separately. CT Abdomen and Pelvis 08/17/2016 FINDINGS: Pharynx and larynx: Larynx and pharynx soft tissue contours are within normal limits.  Negative parapharyngeal spaces. Negative retropharyngeal space aside from small but conspicuous retropharyngeal lymph nodes (e.g. Series 3, image 28 on the right). Salivary glands: Negative sublingual space. Negative submandibular glands and parotid glands. Bilateral parotid space lymph nodes are small but mildly increased in number. Thyroid: Subcentimeter hypodense nodule in the left lobe does not meet consensus criteria for ultrasound follow-up. Negative isthmus and right lobe. Lymph nodes: Increased number of primarily subcentimeter lymph nodes throughout the bilateral neck nodal stations. The largest nodes at the bilateral level IIa and level 1b stations measure up to 10 mm short axis but have maintained fatty hila. There are also 10-11 mm short axis lymph nodes at the left level 4 station. No cystic or necrotic nodes identified. Vascular: Major vascular structures in the neck and at the skullbase are patent. Limited intracranial: Negative. Visualized orbits: Negative. Mastoids and visualized paranasal sinuses: Small left maxillary mucous retention cyst. Minimal anterior right ethmoid sinus mucosal thickening. Otherwise clear. Skeleton: No acute or suspicious osseous lesion in the neck. Upper chest: Chest CT today is reported separately. IMPRESSION: 1. Increased number of predominantly subcentimeter lymph nodes throughout  the bilateral neck nodal stations. Favor Leukemia. Other lymphoproliferative disorder, metastatic process, or infectious/reactive nodes are less likely. 2. Abnormal chest CT findings today are reported separately. Electronically Signed   By: Genevie Ann M.D.   On: 08/18/2016 11:58   Ct Chest W Contrast  Result Date: 08/18/2016 CLINICAL DATA:  Community acquired pneumonia. Fever. Abdominal discomfort and cough. EXAM: CT CHEST WITH CONTRAST TECHNIQUE: Multidetector CT imaging of the chest was performed during intravenous contrast administration. CONTRAST:  9mL ISOVUE-300 IOPAMIDOL (ISOVUE-300) INJECTION 61% COMPARISON:  None. FINDINGS: Cardiovascular: Normal heart size.  No pericardial effusion. Mediastinum/Nodes: The trachea appears patent and is midline. Unremarkable appearance of the esophagus. Enlarged mediastinal lymph nodes identified. Index right paratracheal node measures 1.4 cm, image 48 of series 3. Index sub- carinal lymph node measures 1.6 cm, image 67 of series 3. Left hilar lymph node measures 11 mm, image 67 of series 3. Prominent bilateral axillary lymph nodes identified. Index right axillary node measures 1.6 cm, image 39 of series 3. Index left axillary lymph node measures 1 cm, image 19 of series 3. Lungs/Pleura: Small right pleural effusion. Dense interstitial and airspace consolidation involving the right upper lobe identified. Upper Abdomen: No acute abnormality. Musculoskeletal: No chest wall abnormality. No acute or significant osseous findings. IMPRESSION: 1. Dense right upper lobe airspace consolidation is identified compatible with the clinical history of community acquired pneumonia. 2. Enlarged bilateral axillary, mediastinal and sub- carinal lymph nodes. Findings may be compatible with clinically suspected lymphoma. Correlation with tissue sampling advise. 3. Small right pleural effusion. Electronically Signed   By: Kerby Moors M.D.   On: 08/18/2016 11:52   Ct Abdomen Pelvis W  Contrast  Result Date: 08/17/2016 CLINICAL DATA:  Fever, diarrhea, fatigue and anorexia for 6 days. EXAM: CT ABDOMEN AND PELVIS WITH CONTRAST TECHNIQUE: Multidetector CT imaging of the abdomen and pelvis was performed using the standard protocol following bolus administration of intravenous contrast. CONTRAST:  128mL ISOVUE-300 IOPAMIDOL (ISOVUE-300) INJECTION 61% COMPARISON:  None. FINDINGS: Lower chest: Consolidation in the right middle lobe base, continuing above the upper limit of this study. Inferior mediastinal adenopathy measuring at least 2 cm short axis just to the right of the distal thoracic esophagus. Hepatobiliary: No focal liver abnormality is seen. No gallstones, gallbladder wall thickening, or biliary dilatation. Pancreas: Unremarkable. No pancreatic ductal dilatation or surrounding inflammatory changes. Spleen: No focal  splenic lesions. The spleen is mildly enlarged, measuring 9.0 by 1.5 x 1.4 cm. Adrenals/Urinary Tract: Adrenal glands are unremarkable. Kidneys are normal, without renal calculi, focal lesion, or hydronephrosis. Bladder is unremarkable. Stomach/Bowel: Stomach is within normal limits. Appendix appears normal. No evidence of bowel wall thickening, distention, or inflammatory changes. Vascular/Lymphatic: The abdominal aorta is normal in caliber with mild atherosclerotic calcification. There is extensive adenopathy, with greatest involvement in the retroperitoneum. There is a 10 mm short axis aortocaval node on series 2, image 32 and a 1.4 cm celiac node to the left of midline on series 2, image 32. There is a 12 mm retrocaval node on series 2 image 48. There are multiple para-aortic nodes measuring up to 1.2 cm. There is a left common iliac node measuring 1.3 cm on series 2, image 60. There is a 1.5 cm node adjacent to the left iliac bifurcation on series 2, image 66. There are multiple iliac nodes throughout the pelvis. There are bilateral obturator internus nodes measuring up to 1.6  cm on series 2, image 80. Reproductive: Uterus and bilateral adnexa are unremarkable. Other: No focal inflammation.  No ascites. Musculoskeletal: No acute or significant osseous findings. IMPRESSION: 1. Extensive adenopathy throughout the abdomen and pelvis. This is concerning for a neoplastic process such as lymphoma. 2. Mild splenomegaly.  No focal splenic lesion. 3. Included portions of the lower chest demonstrate inferior mediastinal adenopathy and right middle lobe base lung consolidation. Consider chest CT for evaluation of extent of disease. 4. Consider tissue sampling for diagnosis. If there are no palpable nodes, many of the abdomen/pelvic nodes are amenable to CT-guided biopsy. 5. These results will be called to the ordering clinician or representative by the Radiologist Assistant, and communication documented in the PACS or zVision Dashboard. Electronically Signed   By: Andreas Newport M.D.   On: 08/17/2016 21:28    ASSESSMENT & PLAN:   # 51 year old female patient with no significant past medical history- admitted to the hospital for fevers/chills/worsening cough- with incidental adenopathy  # Generalized lymphadenopathy/lymphocytosis- as noted on the CT of the chest/neck; abdomen and pelvis- suspicious for lymphoma like process.  I had a long discussion the patient- that this would need further workup before labeling as malignancy. We'll discuss with radiology in the morning regarding accessibility of sampling of one of the lymph nodes- to confirm the diagnosis.  # Community-acquired pneumonia- on antibiotics.   # Nausea/diarrhea- question viral infection. Monitor for now.  # Mildly elevated LFTs- recommend acute hepatitis panel. Check LDH.  # Leukocytosis with lymphocytosis- check peripheral smear/pathologist review; and also flow cytometry.  # Above plan of care was discussed the patient in detail. Also discussed with Dr. Manuella Ghazi.  Thank you Dr. Manuella Ghazi for allowing me to participate in  the care of your pleasant patient. Please do not hesitate to contact me with questions or concerns in the interim.    Cammie Sickle, MD 08/18/2016 5:11 PM

## 2016-08-18 NOTE — Progress Notes (Signed)
Initial Nutrition Assessment  DOCUMENTATION CODES:   Obesity unspecified  INTERVENTION:  Provide Ensure Enlive po once daily, each supplement provides 350 kcal and 20 grams of protein.   Encouraged adequate intake of calories and protein through meals and beverages.  NUTRITION DIAGNOSIS:   Inadequate oral intake related to poor appetite, other (see comment) (diarrhea) as evidenced by per patient/family report.  GOAL:   Patient will meet greater than or equal to 90% of their needs  MONITOR:   PO intake, Supplement acceptance, Labs, Weight trends, I & O's  REASON FOR ASSESSMENT:   Malnutrition Screening Tool    ASSESSMENT:   51 year old female with PMHx of DM type 2, HTN, COPD presented with fever, abdominal discomfort, and cough found to have PNA, extensive abdominal lymphadenopathy concerning for lymphoma, diarrhea.   Spoke with patient at bedside. She reports her appetite has been poor for the past week with her infection. She reports she has had taste changes and diarrhea. Denies any abdominal pain or N/V. For the past week she has only been having one meal per day, which is usually fruit with yogurt. Typical intake if 100% of at least 3 meals daily. She reports she is actually feeling better now and was able to complete 100% of breakfast this morning, which was an omelette with cheddar cheese, peppers, mushrooms, white toast with margarine, and coffee with sugar and creamer (380 kcal, 22 grams of protein).   UBW 208 lbs. She reports she feels like she has lost 15 lbs over the past week. Weight in chart is 206.5 lbs. Per this, she has lost 1.5 lbs (0.7% body weight) from reported UBW over the past week, which is not significant for time frame.  Medications reviewed and include: NS with KCl 20 mEq/L @ 75 ml/hr, azithromycine, potassium chloride 30 mEq Q4hrs.  Labs reviewed: Potassium 2.9. Phosphorus and Magnesium WNL.  Nutrition-Focused physical exam completed. Findings are  no fat depletion, no muscle depletion, and no edema.   Patient does not meet criteria for malnutrition at this time.  Discussed with RN.  Diet Order:  Diet regular Room service appropriate? Yes; Fluid consistency: Thin Diet NPO time specified  Skin:  Reviewed, no issues  Last BM:  08/17/2016  Height:   Ht Readings from Last 1 Encounters:  08/17/16 5\' 4"  (5.329 m)    Weight:   Wt Readings from Last 1 Encounters:  08/17/16 206 lb 8 oz (93.7 kg)    Ideal Body Weight:  54.4 kg  BMI:  Body mass index is 35.45 kg/m.  Estimated Nutritional Needs:   Kcal:  1840-2140 (MSJ x 1.2-1.4)  Protein:  94-112 grams (1-1.2 grams/kg)  Fluid:  1.8-2.1 L/day  EDUCATION NEEDS:   No education needs identified at this time  Willey Blade, MS, RD, LDN Pager: [phone] After Hours Pager: [phone]

## 2016-08-18 NOTE — Progress Notes (Signed)
MEDICATION RELATED CONSULT NOTE - INITIAL   Pharmacy Consult for electrolyte replacement/monitoring Indication: hypokalemia  No Known Allergies  Patient Measurements: Height: 5\' 4"  (162.6 cm) Weight: 206 lb 8 oz (93.7 kg) IBW/kg (Calculated) : 54.7 Adjusted Body Weight:   Vital Signs: Temp: 98 F (36.7 C) (04/30 0342) Temp Source: Oral (04/30 0342) BP: 106/51 (04/30 0342) Pulse Rate: 86 (04/30 0342) Intake/Output from previous day: 04/29 0701 - 04/30 0700 In: 442.5 [P.O.:120; I.V.:272.5; IV Piggyback:50] Out: -  Intake/Output from this shift: No intake/output data recorded.  Labs:  Recent Labs  08/17/16 1822 08/18/16 0454  WBC 32.4* 22.3*  HGB 11.5* 9.7*  HCT 34.8* 29.3*  PLT 362 257  CREATININE 0.71 0.76  ALBUMIN 2.5* 2.0*  PROT 6.9 5.3*  AST 59* 33  ALT 66* 47  ALKPHOS 140* 113  BILITOT 0.6 0.3  BILIDIR  --  0.2  IBILI  --  0.1*   Estimated Creatinine Clearance: 90.3 mL/min (by C-G formula based on SCr of 0.76 mg/dL).   Microbiology: No results found for this or any previous visit (from the past 720 hour(s)).  Medical History: History reviewed. No pertinent past medical history.  Medications:  Infusions:  . 0.9 % NaCl with KCl 20 mEq / L 75 mL/hr at 08/18/16 0022  . azithromycin    . cefTRIAXone (ROCEPHIN)  IV    . [START ON 08/19/2016] cefTRIAXone (ROCEPHIN)  IV    . potassium chloride (KCL MULTIRUN) 30 mEq in 265 mL IVPB      Assessment: 51 yof cc fever with no significant PMH admitted for PNA and lymphadenopathy work up. Low electrolytes (K 2.9) noted on AM labs; pharmacy was consulted to monitor and replace electrolytes.  Goal of Therapy:  Electrolytes WNL  Plan:  K 2.9, Ca 7.5, albumin 2, adjusted Ca 9.1, Mg and phos pending (add-on). Patient is currently NPO so will use IV replacement.   1. Potassium chloride 30 mEq IV Q4H x 2 doses  2. Follow up Mg and phos  3. Recheck BMP this evening at 1800  4. Recheck all electrolytes tomorrow with  AM labs  Laural Benes, Pharm.D., BCPS Clinical Pharmacist 08/18/2016,7:50 AM

## 2016-08-18 NOTE — Progress Notes (Addendum)
[place] [facility] [place] NAME: Sheryl Mitchell    MR#:  [id number]  DATE OF BIRTH:  1965/10/23  SUBJECTIVE:  CHIEF COMPLAINT:   Chief Complaint  Patient presents with  . Fever  Having bowel movement, no fever - now concerned about possible lymphoma  REVIEW OF SYSTEMS:  Review of Systems  Constitutional: Positive for chills, fever and malaise/fatigue. Negative for weight loss.  HENT: Negative for nosebleeds and sore throat.   Eyes: Negative for blurred vision.  Respiratory: Negative for cough, shortness of breath and wheezing.   Cardiovascular: Negative for chest pain, orthopnea, leg swelling and PND.  Gastrointestinal: Positive for diarrhea. Negative for abdominal pain, constipation, heartburn, nausea and vomiting.  Genitourinary: Negative for dysuria and urgency.  Musculoskeletal: Negative for back pain.  Skin: Negative for rash.  Neurological: Positive for weakness. Negative for dizziness, speech change, focal weakness and headaches.  Endo/Heme/Allergies: Does not bruise/bleed easily.  Psychiatric/Behavioral: Negative for depression.    DRUG ALLERGIES:  No Known Allergies VITALS:  Blood pressure (!) 106/51, pulse 86, temperature 98 F (36.7 C), temperature source Oral, resp. rate 20, height 5\' 4"  (1.626 m), weight 93.7 kg (206 lb 8 oz), SpO2 99 %. PHYSICAL EXAMINATION:  Physical Exam  Constitutional: She is oriented to person, place, and time and well-developed, well-nourished, and in no distress.  HENT:  Head: Normocephalic and atraumatic.  Eyes: Conjunctivae and EOM are normal. Pupils are equal, round, and reactive to light.  Neck: Normal range of motion. Neck supple. No tracheal deviation present. No thyromegaly present.  Cardiovascular: Normal rate, regular rhythm and normal heart sounds.   Pulmonary/Chest: Effort normal and breath sounds normal. No respiratory distress. She has no wheezes. She exhibits no tenderness.    Abdominal: Soft. Bowel sounds are normal. She exhibits no distension. There is no tenderness.  Musculoskeletal: Normal range of motion.  Neurological: She is alert and oriented to person, place, and time. No cranial nerve deficit.  Skin: Skin is warm and dry. No rash noted.  Psychiatric: Mood and affect normal.   LABORATORY PANEL:  Female CBC  Recent Labs Lab 08/18/16 0454  WBC 22.3*  HGB 9.7*  HCT 29.3*  PLT 257   ------------------------------------------------------------------------------------------------------------------ Chemistries   Recent Labs Lab 08/18/16 0454  NA 139  K 2.9*  CL 108  CO2 24  GLUCOSE 97  BUN 8  CREATININE 0.76  CALCIUM 7.5*  AST 33  ALT 47  ALKPHOS 113  BILITOT 0.3   RADIOLOGY:  Dg Chest 2 View  Result Date: 08/17/2016 CLINICAL DATA:  Fever and cough EXAM: CHEST  2 VIEW COMPARISON:  None. FINDINGS: Cardiac shadow is within normal limits. Right upper lobe infiltrate is seen. The lungs are otherwise clear. No pneumothorax or effusion is noted. No bony abnormality is seen. IMPRESSION: Right upper lobe pneumonia. Followup PA and lateral chest X-ray is recommended in 3-4 weeks following trial of antibiotic therapy to ensure resolution and exclude underlying malignancy. Electronically Signed   By: Inez Catalina M.D.   On: 08/17/2016 19:24   Ct Abdomen Pelvis W Contrast  Result Date: 08/17/2016 CLINICAL DATA:  Fever, diarrhea, fatigue and anorexia for 6 days. EXAM: CT ABDOMEN AND PELVIS WITH CONTRAST TECHNIQUE: Multidetector CT imaging of the abdomen and pelvis was performed using the standard protocol following bolus administration of intravenous contrast. CONTRAST:  144mL ISOVUE-300 IOPAMIDOL (ISOVUE-300) INJECTION 61% COMPARISON:  None. FINDINGS: Lower chest: Consolidation in the right middle lobe base, continuing above the upper  limit of this study. Inferior mediastinal adenopathy measuring at least 2 cm short axis just to the right of the distal  thoracic esophagus. Hepatobiliary: No focal liver abnormality is seen. No gallstones, gallbladder wall thickening, or biliary dilatation. Pancreas: Unremarkable. No pancreatic ductal dilatation or surrounding inflammatory changes. Spleen: No focal splenic lesions. The spleen is mildly enlarged, measuring 9.0 by 1.5 x 1.4 cm. Adrenals/Urinary Tract: Adrenal glands are unremarkable. Kidneys are normal, without renal calculi, focal lesion, or hydronephrosis. Bladder is unremarkable. Stomach/Bowel: Stomach is within normal limits. Appendix appears normal. No evidence of bowel wall thickening, distention, or inflammatory changes. Vascular/Lymphatic: The abdominal aorta is normal in caliber with mild atherosclerotic calcification. There is extensive adenopathy, with greatest involvement in the retroperitoneum. There is a 10 mm short axis aortocaval node on series 2, image 32 and a 1.4 cm celiac node to the left of midline on series 2, image 32. There is a 12 mm retrocaval node on series 2 image 48. There are multiple para-aortic nodes measuring up to 1.2 cm. There is a left common iliac node measuring 1.3 cm on series 2, image 60. There is a 1.5 cm node adjacent to the left iliac bifurcation on series 2, image 66. There are multiple iliac nodes throughout the pelvis. There are bilateral obturator internus nodes measuring up to 1.6 cm on series 2, image 80. Reproductive: Uterus and bilateral adnexa are unremarkable. Other: No focal inflammation.  No ascites. Musculoskeletal: No acute or significant osseous findings. IMPRESSION: 1. Extensive adenopathy throughout the abdomen and pelvis. This is concerning for a neoplastic process such as lymphoma. 2. Mild splenomegaly.  No focal splenic lesion. 3. Included portions of the lower chest demonstrate inferior mediastinal adenopathy and right middle lobe base lung consolidation. Consider chest CT for evaluation of extent of disease. 4. Consider tissue sampling for diagnosis. If  there are no palpable nodes, many of the abdomen/pelvic nodes are amenable to CT-guided biopsy. 5. These results will be called to the ordering clinician or representative by the Radiologist Assistant, and communication documented in the PACS or zVision Dashboard. Electronically Signed   By: Andreas Newport M.D.   On: 08/17/2016 21:28   ASSESSMENT AND PLAN:  51 year old female patient with no past medical history Admitted with fever, abdominal discomfort and cough.  * Community-acquired pneumonia: Continue IV Rocephin and Zithromax  - We will get a CT scan of the chest  * Extensive abdominal lymphadenopathy: Concerning for lymphoma  - We will obtain CT guided biopsy of one of the lymph node  - Await oncology consultation  - Discussed with oncology - plan is to get a CT scan of the neck and chest to see if there are any lymph nodes in the neck/axilla, which will be easily accessible by ultrasound  * Diarrhea - We will get a stool studies, also check stool for C. difficile  * Leukocytosis: Improving with antibiotics   * Severe Hypokalemia - Can be due to ongoing diarrhea  - We will replete and recheck  - Check magnesium   * Abnormal liver function tests - Resolved with IV hydration      All the records are reviewed and case discussed with Care Management/Social Worker. Management plans discussed with the patient, nursing and they are in agreement.  CODE STATUS: Full Code  TOTAL TIME TAKING CARE OF THIS PATIENT: 35 minutes.   More than 50% of the time was spent in counseling/coordination of care: YES  POSSIBLE D/C IN 2-3 DAYS, DEPENDING ON CLINICAL  CONDITION.   Max Sane M.D on 08/18/2016 at 7:27 AM  Between 7am to 6pm - Pager - [phone]  After 6pm go to [email or web address] - Technical brewer [facility]  Office  [phone]  CC: Primary care physician; No PCP Per Patient  Note: This dictation was prepared with Dragon dictation along  with smaller phrase technology. Any transcriptional errors that result from this process are unintentional.

## 2016-08-18 NOTE — Progress Notes (Signed)
MEDICATION RELATED CONSULT NOTE - INITIAL   Pharmacy Consult for electrolyte replacement/monitoring Indication: hypokalemia  No Known Allergies  Patient Measurements: Height: 5\' 4"  (162.6 cm) Weight: 206 lb 8 oz (93.7 kg) IBW/kg (Calculated) : 54.7  Vital Signs: Temp: 98.9 F (37.2 C) (04/30 1249) Temp Source: Oral (04/30 1249) BP: 126/67 (04/30 1249) Pulse Rate: 92 (04/30 1249) Intake/Output from previous day: 04/29 0701 - 04/30 0700 In: 442.5 [P.O.:120; I.V.:272.5; IV Piggyback:50] Out: -  Intake/Output from this shift: Total I/O In: 630 [I.V.:365; IV Piggyback:265] Out: -   Labs:  Recent Labs  08/17/16 1822 08/18/16 0454 08/18/16 0842 08/18/16 1756  WBC 32.4* 22.3*  --   --   HGB 11.5* 9.7*  --   --   HCT 34.8* 29.3*  --   --   PLT 362 257  --   --   CREATININE 0.71 0.76  --  0.70  MG  --   --  2.0  --   PHOS  --   --  3.6  --   ALBUMIN 2.5* 2.0*  --   --   PROT 6.9 5.3*  --   --   AST 59* 33  --   --   ALT 66* 47  --   --   ALKPHOS 140* 113  --   --   BILITOT 0.6 0.3  --   --   BILIDIR  --  0.2  --   --   IBILI  --  0.1*  --   --    Estimated Creatinine Clearance: 90.3 mL/min (by C-G formula based on SCr of 0.7 mg/dL).   Medical History: History reviewed. No pertinent past medical history.  Medications:  Infusions:  . 0.9 % NaCl with KCl 20 mEq / L 75 mL/hr at 08/18/16 1023  . azithromycin    . [START ON 08/19/2016] cefTRIAXone (ROCEPHIN)  IV      Assessment: 51 yof cc fever with no significant PMH admitted for PNA and lymphadenopathy work up. Low electrolytes (K 2.9) noted on AM labs; pharmacy was consulted to monitor and replace electrolytes.  Goal of Therapy:  Electrolytes WNL  Plan:  K = 4.4 this evening after supplementation. No additional supplementation needed at this time, will recheck electrolytes with AM labs tomorrow.  Lenis Noon, Pharm.D., BCPS Clinical Pharmacist 08/18/2016,6:50 PM

## 2016-08-18 NOTE — Progress Notes (Signed)
Chief Complaint: Patient was seen in consultation today for lymph node biopsy at the request of Dr. Max Sane  Referring Physician(s): Dr. Max Sane  Supervising Physician: Corrie Mckusick  Patient Status: [name] - In-pt  History of Present Illness: Sheryl Mitchell is a 51 y.o. female who presented with fever, abd discomfort and diarrhea. Her workup has now found diffuse adenopathy and leukocytosis. IR is asked to perform biopsy to obtain tissue diagnosis. Chart, imaging, meds, lans, allergies reviewed. Pt is very anxious and upset about possible diagnosis.   History reviewed. No pertinent past medical history.  Past Surgical History:  Procedure Laterality Date  . none      Allergies: Patient has no known allergies.  Medications:  Current Facility-Administered Medications:  .  0.9 % NaCl with KCl 20 mEq/ L  infusion, , Intravenous, Continuous, Pavan Pyreddy, MD, Last Rate: 75 mL/hr at 08/18/16 1023 .  acetaminophen (TYLENOL) tablet 650 mg, 650 mg, Oral, Q6H PRN, 650 mg at 08/18/16 0017 **OR** acetaminophen (TYLENOL) suppository 650 mg, 650 mg, Rectal, Q6H PRN, Saundra Shelling, MD .  azithromycin (ZITHROMAX) 500 mg in dextrose 5 % 250 mL IVPB, 500 mg, Intravenous, Q24H, Pavan Pyreddy, MD .  Derrill Memo ON 08/19/2016] cefTRIAXone (ROCEPHIN) 2 g in dextrose 5 % 50 mL IVPB, 2 g, Intravenous, Q24H, Vipul Shah, MD .  Derrill Memo ON 08/19/2016] enoxaparin (LOVENOX) injection 40 mg, 40 mg, Subcutaneous, Q24H, Neyland Pettengill, PA-C .  feeding supplement (ENSURE ENLIVE) (ENSURE ENLIVE) liquid 237 mL, 237 mL, Oral, Q24H, Vipul Shah, MD, 237 mL at 08/18/16 1158 .  guaiFENesin-dextromethorphan (ROBITUSSIN DM) 100-10 MG/5ML syrup 5 mL, 5 mL, Oral, Q4H PRN, Reatha Harps Pyreddy, MD, 5 mL at 08/18/16 1022 .  ondansetron (ZOFRAN) tablet 4 mg, 4 mg, Oral, Q6H PRN **OR** ondansetron (ZOFRAN) injection 4 mg, 4 mg, Intravenous, Q6H PRN, Pavan Pyreddy, MD .  potassium chloride 30 mEq in sodium chloride 0.9 % 265 mL (KCL  MULTIRUN) IVPB, 30 mEq, Intravenous, Q4H, Max Sane, MD, Last Rate: 88.3 mL/hr at 08/18/16 1023, 30 mEq at 08/18/16 1023 .  senna-docusate (Senokot-S) tablet 1 tablet, 1 tablet, Oral, QHS PRN, Pavan Pyreddy, MD .  sodium chloride flush (NS) 0.9 % injection 3 mL, 3 mL, Intravenous, Q12H, Saundra Shelling, MD    Family History  Problem Relation Age of Onset  . Heart disease Mother   . Cancer Father     Social History   Social History  . Marital status: Widowed    Spouse name: N/A  . Number of children: N/A  . Years of education: N/A   Occupational History  .  [employer]   Social History Main Topics  . Smoking status: Former Research scientist (life sciences)  . Smokeless tobacco: Never Used  . Alcohol use 0.6 oz/week    1 Glasses of wine per week  . Drug use: No  . Sexual activity: Yes   Other Topics Concern  . None   Social History Narrative  . None    Review of Systems: A 12 point ROS discussed and pertinent positives are indicated in the HPI above.  All other systems are negative.  Review of Systems  Vital Signs: BP 126/67 (BP Location: Right Arm)   Pulse 92   Temp 98.9 F (37.2 C) (Oral)   Resp 18   Ht 5\' 4"  (1.626 m)   Wt 206 lb 8 oz (93.7 kg)   SpO2 99%   BMI 35.45 kg/m   Physical Exam  Constitutional: She is oriented to person,  place, and time. She appears well-developed and well-nourished. No distress.  HENT:  Head: Normocephalic.  Mouth/Throat: Oropharynx is clear and moist.  Neck: Normal range of motion. No JVD present. No tracheal deviation present.  Cardiovascular: Normal rate, regular rhythm and normal heart sounds.   Pulmonary/Chest: Effort normal and breath sounds normal. No respiratory distress.  Abdominal: Soft. She exhibits no distension and no mass. There is no tenderness.  Lymphadenopathy:    She has no cervical adenopathy.  Neurological: She is alert and oriented to person, place, and time.  Skin: Skin is warm and dry.  Psychiatric: She has a normal mood  and affect. Judgment normal.    Mallampati Score:  MD Evaluation Airway: WNL Heart: WNL Abdomen: WNL Chest/ Lungs: WNL ASA  Classification: 3 Mallampati/Airway Score: Two  Imaging: Dg Chest 2 View  Result Date: 08/17/2016 CLINICAL DATA:  Fever and cough EXAM: CHEST  2 VIEW COMPARISON:  None. FINDINGS: Cardiac shadow is within normal limits. Right upper lobe infiltrate is seen. The lungs are otherwise clear. No pneumothorax or effusion is noted. No bony abnormality is seen. IMPRESSION: Right upper lobe pneumonia. Followup PA and lateral chest X-ray is recommended in 3-4 weeks following trial of antibiotic therapy to ensure resolution and exclude underlying malignancy. Electronically Signed   By: Inez Catalina M.D.   On: 08/17/2016 19:24   Ct Soft Tissue Neck W Contrast  Result Date: 08/18/2016 CLINICAL DATA:  51 year old female with fever and abdominal pain. Extensive abdominal and pelvis lymphadenopathy suspicious for lymphoproliferative disorder or other neoplastic process. EXAM: CT NECK WITH CONTRAST TECHNIQUE: Multidetector CT imaging of the neck was performed using the standard protocol following the bolus administration of intravenous contrast. CONTRAST:  77mL ISOVUE-300 IOPAMIDOL (ISOVUE-300) INJECTION 61% in conjunction with contrast enhanced imaging of the chest reported separately. COMPARISON:  Chest CT today reported separately. CT Abdomen and Pelvis 08/17/2016 FINDINGS: Pharynx and larynx: Larynx and pharynx soft tissue contours are within normal limits. Negative parapharyngeal spaces. Negative retropharyngeal space aside from small but conspicuous retropharyngeal lymph nodes (e.g. Series 3, image 28 on the right). Salivary glands: Negative sublingual space. Negative submandibular glands and parotid glands. Bilateral parotid space lymph nodes are small but mildly increased in number. Thyroid: Subcentimeter hypodense nodule in the left lobe does not meet consensus criteria for ultrasound  follow-up. Negative isthmus and right lobe. Lymph nodes: Increased number of primarily subcentimeter lymph nodes throughout the bilateral neck nodal stations. The largest nodes at the bilateral level IIa and level 1b stations measure up to 10 mm short axis but have maintained fatty hila. There are also 10-11 mm short axis lymph nodes at the left level 4 station. No cystic or necrotic nodes identified. Vascular: Major vascular structures in the neck and at the skullbase are patent. Limited intracranial: Negative. Visualized orbits: Negative. Mastoids and visualized paranasal sinuses: Small left maxillary mucous retention cyst. Minimal anterior right ethmoid sinus mucosal thickening. Otherwise clear. Skeleton: No acute or suspicious osseous lesion in the neck. Upper chest: Chest CT today is reported separately. IMPRESSION: 1. Increased number of predominantly subcentimeter lymph nodes throughout the bilateral neck nodal stations. Favor Leukemia. Other lymphoproliferative disorder, metastatic process, or infectious/reactive nodes are less likely. 2. Abnormal chest CT findings today are reported separately. Electronically Signed   By: Genevie Ann M.D.   On: 08/18/2016 11:58   Ct Chest W Contrast  Result Date: 08/18/2016 CLINICAL DATA:  Community acquired pneumonia. Fever. Abdominal discomfort and cough. EXAM: CT CHEST WITH CONTRAST TECHNIQUE: Multidetector CT  imaging of the chest was performed during intravenous contrast administration. CONTRAST:  94mL ISOVUE-300 IOPAMIDOL (ISOVUE-300) INJECTION 61% COMPARISON:  None. FINDINGS: Cardiovascular: Normal heart size.  No pericardial effusion. Mediastinum/Nodes: The trachea appears patent and is midline. Unremarkable appearance of the esophagus. Enlarged mediastinal lymph nodes identified. Index right paratracheal node measures 1.4 cm, image 48 of series 3. Index sub- carinal lymph node measures 1.6 cm, image 67 of series 3. Left hilar lymph node measures 11 mm, image 67 of  series 3. Prominent bilateral axillary lymph nodes identified. Index right axillary node measures 1.6 cm, image 39 of series 3. Index left axillary lymph node measures 1 cm, image 19 of series 3. Lungs/Pleura: Small right pleural effusion. Dense interstitial and airspace consolidation involving the right upper lobe identified. Upper Abdomen: No acute abnormality. Musculoskeletal: No chest wall abnormality. No acute or significant osseous findings. IMPRESSION: 1. Dense right upper lobe airspace consolidation is identified compatible with the clinical history of community acquired pneumonia. 2. Enlarged bilateral axillary, mediastinal and sub- carinal lymph nodes. Findings may be compatible with clinically suspected lymphoma. Correlation with tissue sampling advise. 3. Small right pleural effusion. Electronically Signed   By: Kerby Moors M.D.   On: 08/18/2016 11:52   Ct Abdomen Pelvis W Contrast  Result Date: 08/17/2016 CLINICAL DATA:  Fever, diarrhea, fatigue and anorexia for 6 days. EXAM: CT ABDOMEN AND PELVIS WITH CONTRAST TECHNIQUE: Multidetector CT imaging of the abdomen and pelvis was performed using the standard protocol following bolus administration of intravenous contrast. CONTRAST:  120mL ISOVUE-300 IOPAMIDOL (ISOVUE-300) INJECTION 61% COMPARISON:  None. FINDINGS: Lower chest: Consolidation in the right middle lobe base, continuing above the upper limit of this study. Inferior mediastinal adenopathy measuring at least 2 cm short axis just to the right of the distal thoracic esophagus. Hepatobiliary: No focal liver abnormality is seen. No gallstones, gallbladder wall thickening, or biliary dilatation. Pancreas: Unremarkable. No pancreatic ductal dilatation or surrounding inflammatory changes. Spleen: No focal splenic lesions. The spleen is mildly enlarged, measuring 9.0 by 1.5 x 1.4 cm. Adrenals/Urinary Tract: Adrenal glands are unremarkable. Kidneys are normal, without renal calculi, focal lesion, or  hydronephrosis. Bladder is unremarkable. Stomach/Bowel: Stomach is within normal limits. Appendix appears normal. No evidence of bowel wall thickening, distention, or inflammatory changes. Vascular/Lymphatic: The abdominal aorta is normal in caliber with mild atherosclerotic calcification. There is extensive adenopathy, with greatest involvement in the retroperitoneum. There is a 10 mm short axis aortocaval node on series 2, image 32 and a 1.4 cm celiac node to the left of midline on series 2, image 32. There is a 12 mm retrocaval node on series 2 image 48. There are multiple para-aortic nodes measuring up to 1.2 cm. There is a left common iliac node measuring 1.3 cm on series 2, image 60. There is a 1.5 cm node adjacent to the left iliac bifurcation on series 2, image 66. There are multiple iliac nodes throughout the pelvis. There are bilateral obturator internus nodes measuring up to 1.6 cm on series 2, image 80. Reproductive: Uterus and bilateral adnexa are unremarkable. Other: No focal inflammation.  No ascites. Musculoskeletal: No acute or significant osseous findings. IMPRESSION: 1. Extensive adenopathy throughout the abdomen and pelvis. This is concerning for a neoplastic process such as lymphoma. 2. Mild splenomegaly.  No focal splenic lesion. 3. Included portions of the lower chest demonstrate inferior mediastinal adenopathy and right middle lobe base lung consolidation. Consider chest CT for evaluation of extent of disease. 4. Consider tissue sampling for diagnosis.  If there are no palpable nodes, many of the abdomen/pelvic nodes are amenable to CT-guided biopsy. 5. These results will be called to the ordering clinician or representative by the Radiologist Assistant, and communication documented in the PACS or zVision Dashboard. Electronically Signed   By: Andreas Newport M.D.   On: 08/17/2016 21:28    Labs:  CBC:  Recent Labs  08/17/16 1822 08/18/16 0454  WBC 32.4* 22.3*  HGB 11.5* 9.7*    HCT 34.8* 29.3*  PLT 362 257    COAGS:  Recent Labs  08/17/16 1822  INR 1.13    BMP:  Recent Labs  08/17/16 1822 08/18/16 0454  NA 135 139  K 2.8* 2.9*  CL 100* 108  CO2 23 24  GLUCOSE 126* 97  BUN 11 8  CALCIUM 8.7* 7.5*  CREATININE 0.71 0.76  GFRNONAA >60 >60  GFRAA >60 >60    LIVER FUNCTION TESTS:  Recent Labs  08/17/16 1822 08/18/16 0454  BILITOT 0.6 0.3  AST 59* 33  ALT 66* 47  ALKPHOS 140* 113  PROT 6.9 5.3*  ALBUMIN 2.5* 2.0*    TUMOR MARKERS: No results for input(s): AFPTM, CEA, CA199, CHROMGRNA in the last 8760 hours.  Assessment and Plan: Fever and Leukocytosis Diffuse adenopathy After review of imaging, feel can safely obtain biopsy from neck/supraclavicular LN with US guidance. Plan for procedure tomorrow. NPO p MN, have adjusted Lovenox dose timing. Risks and Benefits discussed with the patient including, but not limited to bleeding, infection, damage to adjacent structures or low yield requiring additional tests. All of the patient's questions were answered, patient is agreeable to proceed. Consent signed and in chart.    Thank you for this interesting consult.  I greatly enjoyed meeting [facility] and look forward to participating in their care.  A copy of this report was sent to the requesting provider on this date.  Electronically Signed: Ascencion Dike 08/18/2016, 1:13 PM   I spent a total of 20 minutes in face to face in clinical consultation, greater than 50% of which was counseling/coordinating care for LN biopsy

## 2016-08-18 NOTE — Progress Notes (Signed)
PHARMACY - PHYSICIAN COMMUNICATION CRITICAL VALUE ALERT - BLOOD CULTURE IDENTIFICATION (BCID)  Results for orders placed or performed during the hospital encounter of 08/17/16  Blood Culture ID Panel (Reflexed) (Collected: 08/17/2016  6:22 PM)  Result Value Ref Range   Enterococcus species NOT DETECTED NOT DETECTED   Listeria monocytogenes NOT DETECTED NOT DETECTED   Staphylococcus species NOT DETECTED NOT DETECTED   Staphylococcus aureus NOT DETECTED NOT DETECTED   Streptococcus species DETECTED (A) NOT DETECTED   Streptococcus agalactiae NOT DETECTED NOT DETECTED   Streptococcus pneumoniae DETECTED (A) NOT DETECTED   Streptococcus pyogenes NOT DETECTED NOT DETECTED   Acinetobacter baumannii NOT DETECTED NOT DETECTED   Enterobacteriaceae species NOT DETECTED NOT DETECTED   Enterobacter cloacae complex NOT DETECTED NOT DETECTED   Escherichia coli NOT DETECTED NOT DETECTED   Klebsiella oxytoca NOT DETECTED NOT DETECTED   Klebsiella pneumoniae NOT DETECTED NOT DETECTED   Proteus species NOT DETECTED NOT DETECTED   Serratia marcescens NOT DETECTED NOT DETECTED   Haemophilus influenzae NOT DETECTED NOT DETECTED   Neisseria meningitidis NOT DETECTED NOT DETECTED   Pseudomonas aeruginosa NOT DETECTED NOT DETECTED   Candida albicans NOT DETECTED NOT DETECTED   Candida glabrata NOT DETECTED NOT DETECTED   Candida krusei NOT DETECTED NOT DETECTED   Candida parapsilosis NOT DETECTED NOT DETECTED   Candida tropicalis NOT DETECTED NOT DETECTED    Name of physician (or Provider) Contacted: Chen   Changes to prescribed antibiotics required: No, will continue rocephin 2 gm IV Q24H.   Marymargaret Kirker D 08/18/2016  6:25 PM

## 2016-08-19 ENCOUNTER — Inpatient Hospital Stay: Payer: Self-pay

## 2016-08-19 LAB — BASIC METABOLIC PANEL
ANION GAP: 6 (ref 5–15)
BUN: 7 mg/dL (ref 6–20)
CHLORIDE: 113 mmol/L — AB (ref 101–111)
CO2: 23 mmol/L (ref 22–32)
Calcium: 8 mg/dL — ABNORMAL LOW (ref 8.9–10.3)
Creatinine, Ser: 0.61 mg/dL (ref 0.44–1.00)
Glucose, Bld: 99 mg/dL (ref 65–99)
POTASSIUM: 4.2 mmol/L (ref 3.5–5.1)
SODIUM: 142 mmol/L (ref 135–145)

## 2016-08-19 LAB — HEPATITIS PANEL, ACUTE
HCV Ab: <0.1 s/co ratio (ref 0.0–0.9)
Hep A IgM: NEGATIVE
Hep B C IgM: NEGATIVE
Hepatitis B Surface Ag: NEGATIVE

## 2016-08-19 LAB — MAGNESIUM: MAGNESIUM: 1.9 mg/dL (ref 1.7–2.4)

## 2016-08-19 LAB — CBC
HCT: 28.8 % — ABNORMAL LOW (ref 35.0–47.0)
HEMOGLOBIN: 9.5 g/dL — AB (ref 12.0–16.0)
MCH: 29.4 pg (ref 26.0–34.0)
MCHC: 32.9 g/dL (ref 32.0–36.0)
MCV: 89.2 fL (ref 80.0–100.0)
PLATELETS: 308 10*3/uL (ref 150–440)
RBC: 3.23 MIL/uL — AB (ref 3.80–5.20)
RDW: 13.5 % (ref 11.5–14.5)
WBC: 19.9 10*3/uL — ABNORMAL HIGH (ref 3.6–11.0)

## 2016-08-19 LAB — HIV ANTIBODY (ROUTINE TESTING W REFLEX): HIV SCREEN 4TH GENERATION: NONREACTIVE

## 2016-08-19 LAB — PHOSPHORUS: PHOSPHORUS: 4.1 mg/dL (ref 2.5–4.6)

## 2016-08-19 MED ORDER — ZOLPIDEM TARTRATE 5 MG PO TABS
5.0000 mg | ORAL_TABLET | Freq: Every evening | ORAL | Status: DC | PRN
  Administered 2016-08-19 – 2016-08-20 (×2): 5 mg via ORAL
  Filled 2016-08-19 (×2): qty 1

## 2016-08-19 MED ORDER — SODIUM CHLORIDE 0.9 % IV SOLN
INTRAVENOUS | Status: DC
  Administered 2016-08-19: 11:00:00 via INTRAVENOUS

## 2016-08-19 MED ORDER — MIDAZOLAM HCL 2 MG/2ML IJ SOLN
INTRAMUSCULAR | Status: AC
  Administered 2016-08-19: 15:00:00
  Filled 2016-08-19: qty 2

## 2016-08-19 MED ORDER — FENTANYL CITRATE (PF) 100 MCG/2ML IJ SOLN
INTRAMUSCULAR | Status: AC
  Administered 2016-08-19: 15:00:00
  Filled 2016-08-19: qty 2

## 2016-08-19 MED ORDER — FENTANYL CITRATE (PF) 100 MCG/2ML IJ SOLN
INTRAMUSCULAR | Status: AC | PRN
  Administered 2016-08-19: 50 ug via INTRAVENOUS

## 2016-08-19 MED ORDER — MIDAZOLAM HCL 2 MG/2ML IJ SOLN
INTRAMUSCULAR | Status: AC | PRN
  Administered 2016-08-19: 1 mg via INTRAVENOUS

## 2016-08-19 NOTE — Procedures (Signed)
Diffuse Adenopathy  Ultrasound left supraclavicular adenopathy 18-gauge core biopsies  No immediate complication  EBL less than 5 mL  Pathology pending  Full report in PACs

## 2016-08-19 NOTE — Care Management Note (Signed)
Case Management Note  Patient Details  Name: Sheryl Mitchell MRN: [id number] Date of Birth: Sep 26, 1965  Subjective/Objective:     Admitted to this facility with the diagnosis of community acquired pneumonia. Lives alone, Sister is Butch Penny [phone]). No primary care physician. No insurance. Works at [employer]. Moved from [place] to [place] 2 years ago. Originally from [place]. Takes care of all basic and instrumental activities of daily living herself, drives. No fall. Good appetite. No pharmacy. Sister will transport.               Action/Plan:  Application for [facility] and Medication Management diven   Expected Discharge Date:                  Expected Discharge Plan:     In-House Referral:     Discharge planning Services     Post Acute Care Choice:    Choice offered to:     DME Arranged:    DME Agency:     HH Arranged:    HH Agency:     Status of Service:     If discussed at [employer], dates discussed:    Additional Comments:   A resident of [facility].  Shelbie Ammons, RN MSN CCM Care Management [phone] 08/19/2016, 8:52 AM

## 2016-08-19 NOTE — Progress Notes (Signed)
Patient clinically stable post procedure, bandade dressing c/d/I. Vitals stable, denies complaints at this time. Report given to Deidra RN on 1c with plan reviewed.

## 2016-08-19 NOTE — Progress Notes (Signed)
MEDICATION RELATED CONSULT NOTE - INITIAL   Pharmacy Consult for electrolyte replacement/monitoring Indication: hypokalemia  No Known Allergies  Patient Measurements: Height: 5\' 4"  (162.6 cm) Weight: 206 lb 8 oz (93.7 kg) IBW/kg (Calculated) : 54.7  Vital Signs: Temp: 98.5 F (36.9 C) (05/01 0438) Temp Source: Oral (05/01 0438) BP: 121/70 (05/01 0438) Pulse Rate: 92 (05/01 0438) Intake/Output from previous day: 04/30 0701 - 05/01 0700 In: 630 [I.V.:365; IV Piggyback:265] Out: -  Intake/Output from this shift: No intake/output data recorded.  Labs:  Recent Labs  08/17/16 1822 08/18/16 0454 08/18/16 0842 08/18/16 1756 08/19/16 0400  WBC 32.4* 22.3*  --   --  19.9*  HGB 11.5* 9.7*  --   --  9.5*  HCT 34.8* 29.3*  --   --  28.8*  PLT 362 257  --   --  308  CREATININE 0.71 0.76  --  0.70 0.61  MG  --   --  2.0  --  1.9  PHOS  --   --  3.6  --  4.1  ALBUMIN 2.5* 2.0*  --   --   --   PROT 6.9 5.3*  --   --   --   AST 59* 33  --   --   --   ALT 66* 47  --   --   --   ALKPHOS 140* 113  --   --   --   BILITOT 0.6 0.3  --   --   --   BILIDIR  --  0.2  --   --   --   IBILI  --  0.1*  --   --   --    Estimated Creatinine Clearance: 90.3 mL/min (by C-G formula based on SCr of 0.61 mg/dL).   Medical History: History reviewed. No pertinent past medical history.  Medications:  Infusions:  . 0.9 % NaCl with KCl 20 mEq / L 75 mL/hr at 08/19/16 0114  . azithromycin Stopped (08/18/16 2317)  . cefTRIAXone (ROCEPHIN)  IV      Assessment: 51 yof cc fever with no significant PMH admitted for PNA and lymphadenopathy work up. Low electrolytes (K 2.9) noted on AM labs; pharmacy was consulted to monitor and replace electrolytes.  Goal of Therapy:  Electrolytes WNL  Plan:  K = 4.4 this evening after supplementation. No additional supplementation needed at this time, will recheck electrolytes with AM labs tomorrow.  5/1 0400 K 4.2, Ca 8, albumin 2, adjusted Ca 8.6, Mg 1.9,  phos 4.1. No further supplement warranted. Will recheck electrolytes tomorrow with AM labs.   Laural Benes, Pharm.D., BCPS Clinical Pharmacist 08/19/2016,7:09 AM

## 2016-08-19 NOTE — Progress Notes (Signed)
[facility] at [place] NAME: Sheryl Mitchell    MR#:  [id number]  DATE OF BIRTH:  [birth year]  SUBJECTIVE:  CHIEF COMPLAINT:   Chief Complaint  Patient presents with  . Fever  feels anxious, Waiting for oncology to see her to decide further course REVIEW OF SYSTEMS:  Review of Systems  Constitutional: Positive for chills, fever and malaise/fatigue. Negative for weight loss.  HENT: Negative for nosebleeds and sore throat.   Eyes: Negative for blurred vision.  Respiratory: Negative for cough, shortness of breath and wheezing.   Cardiovascular: Negative for chest pain, orthopnea, leg swelling and PND.  Gastrointestinal: Positive for diarrhea. Negative for abdominal pain, constipation, heartburn, nausea and vomiting.  Genitourinary: Negative for dysuria and urgency.  Musculoskeletal: Negative for back pain.  Skin: Negative for rash.  Neurological: Positive for weakness. Negative for dizziness, speech change, focal weakness and headaches.  Endo/Heme/Allergies: Does not bruise/bleed easily.  Psychiatric/Behavioral: Negative for depression.   DRUG ALLERGIES:  No Known Allergies VITALS:  Blood pressure 136/69, pulse 89, temperature 98.2 F (36.8 C), temperature source Oral, resp. rate (!) 28, height 5\' 4"  (1.626 m), weight 93.7 kg (206 lb 8 oz), SpO2 97 %. PHYSICAL EXAMINATION:  Physical Exam  Constitutional: She is oriented to person, place, and time and well-developed, well-nourished, and in no distress.  HENT:  Head: Normocephalic and atraumatic.  Eyes: Conjunctivae and EOM are normal. Pupils are equal, round, and reactive to light.  Neck: Normal range of motion. Neck supple. No tracheal deviation present. No thyromegaly present.  Cardiovascular: Normal rate, regular rhythm and normal heart sounds.   Pulmonary/Chest: Effort normal and breath sounds normal. No respiratory distress. She has no wheezes. She exhibits no tenderness.    Abdominal: Soft. Bowel sounds are normal. She exhibits no distension. There is no tenderness.  Musculoskeletal: Normal range of motion.  Neurological: She is alert and oriented to person, place, and time. No cranial nerve deficit.  Skin: Skin is warm and dry. No rash noted.  Psychiatric: Mood and affect normal.   LABORATORY PANEL:  Female CBC  Recent Labs Lab 08/19/16 0400  WBC 19.9*  HGB 9.5*  HCT 28.8*  PLT 308   ------------------------------------------------------------------------------------------------------------------ Chemistries   Recent Labs Lab 08/18/16 0454  08/19/16 0400  NA 139  < > 142  K 2.9*  < > 4.2  CL 108  < > 113*  CO2 24  < > 23  GLUCOSE 97  < > 99  BUN 8  < > 7  CREATININE 0.76  < > 0.61  CALCIUM 7.5*  < > 8.0*  MG  --   < > 1.9  AST 33  --   --   ALT 47  --   --   ALKPHOS 113  --   --   BILITOT 0.3  --   --   < > = values in this interval not displayed. RADIOLOGY:  US Biopsy  Result Date: 08/19/2016 INDICATION: Diffuse adenopathy EXAM: ULTRASOUND GUIDED CORE BIOPSY OF LEFT SUPRACLAVICULAR ADENOPATHY MEDICATIONS: 1% LIDOCAINE LOCALLY ANESTHESIA/SEDATION: Versed 1.0mg  IV; Fentanyl 25mcg IV; Moderate Sedation Time:  11 MINUTES The patient was continuously monitored during the procedure by the interventional radiology nurse under my direct supervision. FLUOROSCOPY TIME:  Fluoroscopy Time: None. COMPLICATIONS: None immediate. PROCEDURE: The procedure, risks, benefits, and alternatives were explained to the patient. Questions regarding the procedure were encouraged and answered. The patient understands and consents to the procedure. The left neck was  prepped with chloroprep in a sterile fashion, and a sterile drape was applied covering the operative field. A sterile gown and sterile gloves were used for the procedure. Local anesthesia was provided with 1% Lidocaine. Previous imaging reviewed. Preliminary ultrasound performed. Left supraclavicular  adenopathy localized. Overlying skin marked. Under sterile conditions and local anesthesia, an 18 gauge core biopsy was advanced to the left supraclavicular adenopathy. Several 18 gauge core biopsies obtained. Samples were placed on a saline moist Telfa. No immediate complication.  Patient tolerated the biopsy well. FINDINGS: Imaging confirms needle placement to the left supraclavicular adenopathy for core biopsy IMPRESSION: Successful ultrasound left supraclavicular adenopathy 18 gauge core biopsy Electronically Signed   By: Jerilynn Mages.  Shick M.D.   On: 08/19/2016 12:06   ASSESSMENT AND PLAN:  51 year old female patient with no past medical history Admitted with fever, abdominal discomfort and cough.  * Community-acquired pneumonia: Continue IV Rocephin and Zithromax  - CT chest confirmed same  * Extensive abdominal/neck lymphadenopathy: Concerning for lymphoma  - Status post ultrasound-guided left supraclavicular Biopsy  * Diarrhea - Stool for C. difficile negative - Other stool studies negative  * Leukocytosis: Improving with antibiotics   * Severe Hypokalemia - Repleted and resolved  * Abnormal liver function tests - Resolved with IV hydration      All the records are reviewed and case discussed with Care Management/Social Worker. Management plans discussed with the patient, nursing and they are in agreement.  CODE STATUS: Full Code  TOTAL TIME TAKING CARE OF THIS PATIENT: 25 minutes.   More than 50% of the time was spent in counseling/coordination of care: YES  POSSIBLE D/C IN 1-2 DAYS, DEPENDING ON CLINICAL CONDITION.   Max Sane M.D on 08/19/2016 at 3:00 PM  Between 7am to 6pm - Pager - [phone]  After 6pm go to [email or web address] - Technical brewer [facility]  Office  [phone]  CC: Primary care physician; No PCP Per Patient  Note: This dictation was prepared with Dragon dictation along with smaller phrase technology. Any  transcriptional errors that result from this process are unintentional.

## 2016-08-20 LAB — BASIC METABOLIC PANEL
ANION GAP: 5 (ref 5–15)
BUN: 7 mg/dL (ref 6–20)
CO2: 23 mmol/L (ref 22–32)
Calcium: 7.8 mg/dL — ABNORMAL LOW (ref 8.9–10.3)
Chloride: 113 mmol/L — ABNORMAL HIGH (ref 101–111)
Creatinine, Ser: 0.51 mg/dL (ref 0.44–1.00)
GFR calc Af Amer: >60 mL/min (ref >60)
GFR calc non Af Amer: >60 mL/min (ref >60)
GLUCOSE: 104 mg/dL — AB (ref 65–99)
POTASSIUM: 3.6 mmol/L (ref 3.5–5.1)
Sodium: 141 mmol/L (ref 135–145)

## 2016-08-20 LAB — CULTURE, BLOOD (ROUTINE X 2): SPECIAL REQUESTS: ADEQUATE

## 2016-08-20 LAB — PHOSPHORUS: Phosphorus: 4.4 mg/dL (ref 2.5–4.6)

## 2016-08-20 LAB — MAGNESIUM: Magnesium: 1.8 mg/dL (ref 1.7–2.4)

## 2016-08-20 MED ORDER — POTASSIUM CHLORIDE CRYS ER 20 MEQ PO TBCR
40.0000 meq | EXTENDED_RELEASE_TABLET | Freq: Once | ORAL | Status: AC
  Administered 2016-08-20: 09:00:00 40 meq via ORAL
  Filled 2016-08-20: qty 2

## 2016-08-20 MED ORDER — LORAZEPAM 1 MG PO TABS
1.0000 mg | ORAL_TABLET | Freq: Three times a day (TID) | ORAL | Status: DC | PRN
  Administered 2016-08-20 – 2016-08-21 (×2): 1 mg via ORAL
  Filled 2016-08-20 (×2): qty 1

## 2016-08-20 MED ORDER — AZITHROMYCIN 500 MG PO TABS
250.0000 mg | ORAL_TABLET | Freq: Every day | ORAL | Status: DC
  Administered 2016-08-20: 21:00:00 250 mg via ORAL
  Filled 2016-08-20: qty 1

## 2016-08-20 NOTE — Progress Notes (Signed)
   08/20/16 2000  Clinical Encounter Type  Visited With Patient  Visit Type Follow-up;Spiritual support;Social support  Referral From Nurse  Stress Factors  Patient Stress Factors Health changes  Ch responded to consult request for prayer by patient; Ch met with patient at bedside; Ch offered prayer along with spiritual and emotional support; Patient indicated that she was fearful of diagnosis and concerned; [place] also discussed her support system and her estranged sister in [place]. Gwynn Burly 8:29 PM

## 2016-08-20 NOTE — Progress Notes (Signed)
Patient is anxious about pending biopsy results.  This nurse offered to have spiritual care come by to see her.  Patient stated she would like them to come pray with her.  This nurse also offered to paged Dr. Manuella Ghazi for some anxiety medication.  She agreed with this.  This nurse also spent some time just sitting with patient and talking about her feeling.  Spiritual care will be by to visit with patient and new orders received from Dr. Manuella Ghazi.  Clarise Cruz, RN

## 2016-08-20 NOTE — Progress Notes (Signed)
[facility] at [place] NAME: Sheryl Mitchell    MR#:  [id number]  DATE OF BIRTH:  04-26-1965  SUBJECTIVE:  CHIEF COMPLAINT:   Chief Complaint  Patient presents with  . Fever  Seems very anxious.  Feels it is lot going on REVIEW OF SYSTEMS:  Review of Systems  Constitutional: Positive for chills, fever and malaise/fatigue. Negative for weight loss.  HENT: Negative for nosebleeds and sore throat.   Eyes: Negative for blurred vision.  Respiratory: Negative for cough, shortness of breath and wheezing.   Cardiovascular: Negative for chest pain, orthopnea, leg swelling and PND.  Gastrointestinal: Positive for diarrhea. Negative for abdominal pain, constipation, heartburn, nausea and vomiting.  Genitourinary: Negative for dysuria and urgency.  Musculoskeletal: Negative for back pain.  Skin: Negative for rash.  Neurological: Positive for weakness. Negative for dizziness, speech change, focal weakness and headaches.  Endo/Heme/Allergies: Does not bruise/bleed easily.  Psychiatric/Behavioral: Negative for depression.   DRUG ALLERGIES:  No Known Allergies VITALS:  Blood pressure 124/68, pulse 97, temperature 98.6 F (37 C), temperature source Oral, resp. rate 16, height 5\' 4"  (1.626 m), weight 93.7 kg (206 lb 8 oz), SpO2 99 %. PHYSICAL EXAMINATION:  Physical Exam  Constitutional: She is oriented to person, place, and time and well-developed, well-nourished, and in no distress.  HENT:  Head: Normocephalic and atraumatic.  Eyes: Conjunctivae and EOM are normal. Pupils are equal, round, and reactive to light.  Neck: Normal range of motion. Neck supple. No tracheal deviation present. No thyromegaly present.  Cardiovascular: Normal rate, regular rhythm and normal heart sounds.   Pulmonary/Chest: Effort normal and breath sounds normal. No respiratory distress. She has no wheezes. She exhibits no tenderness.  Abdominal: Soft. Bowel sounds are normal.  She exhibits no distension. There is no tenderness.  Musculoskeletal: Normal range of motion.  Neurological: She is alert and oriented to person, place, and time. No cranial nerve deficit.  Skin: Skin is warm and dry. No rash noted.  Psychiatric: Mood and affect normal.   LABORATORY PANEL:  Female CBC  Recent Labs Lab 08/19/16 0400  WBC 19.9*  HGB 9.5*  HCT 28.8*  PLT 308   ------------------------------------------------------------------------------------------------------------------ Chemistries   Recent Labs Lab 08/18/16 0454  08/20/16 0356  NA 139  < > 141  K 2.9*  < > 3.6  CL 108  < > 113*  CO2 24  < > 23  GLUCOSE 97  < > 104*  BUN 8  < > 7  CREATININE 0.76  < > 0.51  CALCIUM 7.5*  < > 7.8*  MG  --   < > 1.8  AST 33  --   --   ALT 47  --   --   ALKPHOS 113  --   --   BILITOT 0.3  --   --   < > = values in this interval not displayed. RADIOLOGY:  No results found. ASSESSMENT AND PLAN:  51 year old female patient with no past medical history Admitted with fever, abdominal discomfort and cough.  *Strep pneumo bacteremia -Likely source lung -Continue current antibiotics -ID consult  * Community-acquired pneumonia: Continue IV Rocephin and Zithromax  - CT chest confirmed same  * Extensive abdominal/neck lymphadenopathy: Concerning for lymphoma  - Status post ultrasound-guided left supraclavicular Biopsy -pending results -Oncology following  * Diarrhea - Stool for C. difficile negative - Other stool studies negative  * Leukocytosis: Improving with antibiotics   * Severe Hypokalemia - Repleted and  resolved  * Abnormal liver function tests - Resolved with IV hydration      All the records are reviewed and case discussed with Care Management/Social Worker. Management plans discussed with the patient, nursing, oncology and they are in agreement.  CODE STATUS: Full Code  TOTAL TIME TAKING CARE OF THIS PATIENT: 25 minutes.   More than 50% of the  time was spent in counseling/coordination of care: YES  POSSIBLE D/C IN 1-2 DAYS, DEPENDING ON CLINICAL CONDITION.   Max Sane M.D on 08/20/2016 at 1:14 PM  Between 7am to 6pm - Pager - [phone]  After 6pm go to [email or web address] - Technical brewer [facility]  Office  [phone]  CC: Primary care physician; No PCP Per Patient  Note: This dictation was prepared with Dragon dictation along with smaller phrase technology. Any transcriptional errors that result from this process are unintentional.

## 2016-08-20 NOTE — Progress Notes (Signed)
MEDICATION RELATED CONSULT NOTE - INITIAL   Pharmacy Consult for electrolyte replacement/monitoring Indication: hypokalemia  No Known Allergies  Patient Measurements: Height: 5\' 4"  (162.6 cm) Weight: 206 lb 8 oz (93.7 kg) IBW/kg (Calculated) : 54.7  Vital Signs: Temp: 98.6 F (37 C) (05/02 0314) Temp Source: Oral (05/02 0314) BP: 124/68 (05/02 0314) Pulse Rate: 97 (05/02 0314) Intake/Output from previous day: 05/01 0701 - 05/02 0700 In: 3435.1 [I.V.:2935.1; IV Piggyback:500] Out: -  Intake/Output from this shift: No intake/output data recorded.  Labs:  Recent Labs  08/17/16 1822 08/18/16 0454 08/18/16 0842 08/18/16 1756 08/19/16 0400 08/20/16 0356  WBC 32.4* 22.3*  --   --  19.9*  --   HGB 11.5* 9.7*  --   --  9.5*  --   HCT 34.8* 29.3*  --   --  28.8*  --   PLT 362 257  --   --  308  --   CREATININE 0.71 0.76  --  0.70 0.61 0.51  MG  --   --  2.0  --  1.9 1.8  PHOS  --   --  3.6  --  4.1 4.4  ALBUMIN 2.5* 2.0*  --   --   --   --   PROT 6.9 5.3*  --   --   --   --   AST 59* 33  --   --   --   --   ALT 66* 47  --   --   --   --   ALKPHOS 140* 113  --   --   --   --   BILITOT 0.6 0.3  --   --   --   --   BILIDIR  --  0.2  --   --   --   --   IBILI  --  0.1*  --   --   --   --    Estimated Creatinine Clearance: 90.3 mL/min (by C-G formula based on SCr of 0.51 mg/dL).   Medical History: History reviewed. No pertinent past medical history.  Medications:  Infusions:  . sodium chloride Stopped (08/19/16 1226)  . 0.9 % NaCl with KCl 20 mEq / L 75 mL/hr at 08/20/16 0445  . azithromycin Stopped (08/19/16 2215)  . cefTRIAXone (ROCEPHIN)  IV Stopped (08/19/16 0957)    Assessment: 70 yof cc fever with no significant PMH admitted for PNA and lymphadenopathy work up. Low electrolytes (K 2.9) noted on AM labs; pharmacy was consulted to monitor and replace electrolytes.  Goal of Therapy:  Electrolytes WNL  Plan:  K = 4.4 this evening after supplementation. No  additional supplementation needed at this time, will recheck electrolytes with AM labs tomorrow.  5/1 0400 K 4.2, Ca 8, albumin 2, adjusted Ca 9.6, Mg 1.9, phos 4.1. No further supplement warranted. Will recheck electrolytes tomorrow with AM labs.   5/2 0356 K 3.6, Ca 7.8, albumin 2, adjusted Ca 9.4, Mg 1.8, phos 4.4. Will give potassium chloride 40 mEq PO x 1 and recheck BMP tomorrow with AM labs.  Laural Benes, Pharm.D., BCPS Clinical Pharmacist 08/20/2016,7:14 AM

## 2016-08-20 NOTE — Progress Notes (Signed)
Sheryl Mitchell   DOB:1965/08/20   JI#:[id number]    Subjective: Patient had a biopsy of her supraclavicular Lymph node. Procedure was uneventful. Patient's cough is improving. Fevers are improving. However she feels feels fatigued.  No nausea no vomiting.  she is very anxious- especially after knowing that the blood cultures come back positive.  Objective:  Vitals:   08/20/16 0314 08/20/16 1400  BP: 124/68 (!) 152/90  Pulse: 97 99  Resp: 16 18  Temp: 98.6 F (37 C) 98.2 F (36.8 C)     Intake/Output Summary (Last 24 hours) at 08/20/16 1830 Last data filed at 08/20/16 1514  Gross per 24 hour  Intake          2591.25 ml  Output                0 ml  Net          2591.25 ml    GENERAL Alert, no distress and comfortable. Alone.  EYES: no pallor or icterus OROPHARYNX: no thrush or ulceration. NECK: supple, no masses felt LYMPH:  no palpable lymphadenopathy in the cervical, axillary or inguinal regions LUNGS: decreased breath sounds to auscultationRight side.  No wheeze or crackles HEART/CVS: regular rate & rhythm and no murmurs; No lower extremity edema ABDOMEN: abdomen soft, tender  on deep palpation. and normal bowel sounds Musculoskeletal:no cyanosis of digits and no clubbing  PSYCH: alert & oriented x 3 with fluent speech NEURO: no focal motor/sensory deficits SKIN:  no rashes or significant lesions   Labs:  Lab Results  Component Value Date   WBC 19.9 (H) 08/19/2016   HGB 9.5 (L) 08/19/2016   HCT 28.8 (L) 08/19/2016   MCV 89.2 08/19/2016   PLT 308 08/19/2016   NEUTROABS 19.4 (H) 08/17/2016    Lab Results  Component Value Date   NA 141 08/20/2016   K 3.6 08/20/2016   CL 113 (H) 08/20/2016   CO2 23 08/20/2016    Studies:  US Biopsy  Result Date: 08/19/2016 INDICATION: Diffuse adenopathy EXAM: ULTRASOUND GUIDED CORE BIOPSY OF LEFT SUPRACLAVICULAR ADENOPATHY MEDICATIONS: 1% LIDOCAINE LOCALLY ANESTHESIA/SEDATION: Versed 1.0mg  IV; Fentanyl 85mcg IV; Moderate Sedation  Time:  11 MINUTES The patient was continuously monitored during the procedure by the interventional radiology nurse under my direct supervision. FLUOROSCOPY TIME:  Fluoroscopy Time: None. COMPLICATIONS: None immediate. PROCEDURE: The procedure, risks, benefits, and alternatives were explained to the patient. Questions regarding the procedure were encouraged and answered. The patient understands and consents to the procedure. The left neck was prepped with chloroprep in a sterile fashion, and a sterile drape was applied covering the operative field. A sterile gown and sterile gloves were used for the procedure. Local anesthesia was provided with 1% Lidocaine. Previous imaging reviewed. Preliminary ultrasound performed. Left supraclavicular adenopathy localized. Overlying skin marked. Under sterile conditions and local anesthesia, an 18 gauge core biopsy was advanced to the left supraclavicular adenopathy. Several 18 gauge core biopsies obtained. Samples were placed on a saline moist Telfa. No immediate complication.  Patient tolerated the biopsy well. FINDINGS: Imaging confirms needle placement to the left supraclavicular adenopathy for core biopsy IMPRESSION: Successful ultrasound left supraclavicular adenopathy 18 gauge core biopsy Electronically Signed   By: Jerilynn Mages.  Shick M.D.   On: 08/19/2016 12:06    Assessment & Plan:   51 year old female patient currently in the hospital for right sided pneumonia; incidental extensive adenopathy suspicious for lymphoma  # Generalized lymphadenopathy/lymphocytosis-above and below the diaphragm. Status post supraclavicular lymph node biopsy- awaiting results.  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 However I would biopsy.    # Community-acquired pneumonia/strep pneumonia positive blood cultures on antibiotics. Awaiting ID evaluation.  # Mildly elevated LFTs- secondary to underlying sepsis; acute hepatitis panel negative. HIV negative.   #  Leukocytosis with lymphocytosis- improving - component of acute reaction from underlying infection. pathology review of smear- no blasts noted. Flow cytometry pending.  Discussed with Dr. Manuella Ghazi.    Cammie Sickle, MD 08/20/2016  6:30 PM

## 2016-08-20 NOTE — Consult Note (Signed)
[facility]     Reason for Consult:Strep PNA bacteremic pneumonia    Referring Physician: Carlynn Spry Date of Admission:  08/17/2016   Active Problems:   CAP (community acquired pneumonia)   HPI: Sheryl Mitchell is a 51 y.o. female admitted with cough, fever and diarrhea. She had CXR showing PNA and CT with LAN. On admit wbc was 32, Temp 100.5, BCX + Strep PNA. Clinically improving. She has had LN bxp on L neck.  She denies any prodrome of B symptoms prior to one week ago. She works in [employer], no travel, no pets at home. Lives alone.   History reviewed. No pertinent past medical history. Past Surgical History:  Procedure Laterality Date  . none     Social History  Substance Use Topics  . Smoking status: Former Research scientist (life sciences)  . Smokeless tobacco: Never Used  . Alcohol use 0.6 oz/week    1 Glasses of wine per week   Family History  Problem Relation Age of Onset  . Heart disease Mother   . Cancer Father     Allergies: No Known Allergies  Current antibiotics: Antibiotics Given (last 72 hours)    Date/Time Action Medication Dose Rate   08/17/16 1939 New Bag/Given   piperacillin-tazobactam (ZOSYN) IVPB 3.375 g 3.375 g 100 mL/hr   08/17/16 2010 New Bag/Given   vancomycin (VANCOCIN) IVPB 1000 mg/200 mL premix 1,000 mg 200 mL/hr   08/17/16 2141 New Bag/Given   azithromycin (ZITHROMAX) 500 mg in dextrose 5 % 250 mL IVPB 500 mg 250 mL/hr   08/18/16 0018 Given   cefTRIAXone (ROCEPHIN) IVPB 1 g 1 g 100 mL/hr   08/18/16 0855 New Bag/Given   cefTRIAXone (ROCEPHIN) 1 g in dextrose 5 % 50 mL IVPB 1 g 100 mL/hr   08/18/16 2150 New Bag/Given   azithromycin (ZITHROMAX) 500 mg in dextrose 5 % 250 mL IVPB 500 mg 250 mL/hr   08/19/16 0924 New Bag/Given   cefTRIAXone (ROCEPHIN) 2 g in dextrose 5 % 50 mL IVPB 2 g 100 mL/hr   08/19/16 2149 New Bag/Given   azithromycin (ZITHROMAX) 500 mg in dextrose 5 % 250 mL IVPB 500 mg 250 mL/hr   08/20/16 0842 New Bag/Given    cefTRIAXone (ROCEPHIN) 2 g in dextrose 5 % 50 mL IVPB 2 g 100 mL/hr     MEDICATIONS: . enoxaparin (LOVENOX) injection  40 mg Subcutaneous Q24H  . feeding supplement (ENSURE ENLIVE)  237 mL Oral Q24H  . sodium chloride flush  3 mL Intravenous Q12H    Review of Systems - 11 systems reviewed and negative per HPI   OBJECTIVE: Temp:  [98.2 F (36.8 C)-99 F (37.2 C)] 98.6 F (37 C) (05/02 0314) Pulse Rate:  [89-97] 97 (05/02 0314) Resp:  [14-16] 16 (05/02 0314) BP: (124-141)/(68-85) 124/68 (05/02 0314) SpO2:  [97 %-100 %] 99 % (05/02 0314) Physical Exam  Constitutional:  oriented to person, place, and time. appears well-developed and well-nourished. No distress.  HENT: [place]/AT, PERRLA, no scleral icterus Mouth/Throat: Oropharynx is clear and moist. No oropharyngeal exudate.  Cardiovascular: Normal rate, regular rhythm and normal heart sounds. Exam reveals no gallop and no friction rub.  No murmur heard.  Pulmonary/Chest: Effort normal and breath sounds normal. No respiratory distress.  has no wheezes.  Neck = supple, no nuchal rigidity Abdominal: Soft. Bowel sounds are normal.  exhibits no distension. There is no tenderness.  Lymphadenopathy: mild shoddy LAN ant cervical  Neurological: alert and oriented to person, place, and  time.  Skin: Skin is warm and dry. No rash noted. No erythema.  Psychiatric: a normal mood and affect.  behavior is normal.    LABS: Results for orders placed or performed during the hospital encounter of 08/17/16 (from the past 48 hour(s))  Basic metabolic panel     Status: Abnormal   Collection Time: 08/18/16  5:56 PM  Result Value Ref Range   Sodium 140 135 - 145 mmol/L   Potassium 4.4 3.5 - 5.1 mmol/L   Chloride 111 101 - 111 mmol/L   CO2 24 22 - 32 mmol/L   Glucose, Bld 107 (H) 65 - 99 mg/dL   BUN 7 6 - 20 mg/dL   Creatinine, Ser 0.70 0.44 - 1.00 mg/dL   Calcium 8.1 (L) 8.9 - 10.3 mg/dL   GFR calc non Af Amer >60 >60 mL/min   GFR calc Af Amer >60  >60 mL/min    Comment: (NOTE) The eGFR has been calculated using the CKD EPI equation. This calculation has not been validated in all clinical situations. eGFR's persistently <60 mL/min signify possible Chronic Kidney Disease.    Anion gap 5 5 - 15  CBC     Status: Abnormal   Collection Time: 08/19/16  4:00 AM  Result Value Ref Range   WBC 19.9 (H) 3.6 - 11.0 K/uL   RBC 3.23 (L) 3.80 - 5.20 MIL/uL   Hemoglobin 9.5 (L) 12.0 - 16.0 g/dL   HCT 28.8 (L) 35.0 - 47.0 %   MCV 89.2 80.0 - 100.0 fL   MCH 29.4 26.0 - 34.0 pg   MCHC 32.9 32.0 - 36.0 g/dL   RDW 13.5 11.5 - 14.5 %   Platelets 308 150 - 440 K/uL  Basic metabolic panel     Status: Abnormal   Collection Time: 08/19/16  4:00 AM  Result Value Ref Range   Sodium 142 135 - 145 mmol/L   Potassium 4.2 3.5 - 5.1 mmol/L   Chloride 113 (H) 101 - 111 mmol/L   CO2 23 22 - 32 mmol/L   Glucose, Bld 99 65 - 99 mg/dL   BUN 7 6 - 20 mg/dL   Creatinine, Ser 0.61 0.44 - 1.00 mg/dL   Calcium 8.0 (L) 8.9 - 10.3 mg/dL   GFR calc non Af Amer >60 >60 mL/min   GFR calc Af Amer >60 >60 mL/min    Comment: (NOTE) The eGFR has been calculated using the CKD EPI equation. This calculation has not been validated in all clinical situations. eGFR's persistently <60 mL/min signify possible Chronic Kidney Disease.    Anion gap 6 5 - 15  Magnesium     Status: None   Collection Time: 08/19/16  4:00 AM  Result Value Ref Range   Magnesium 1.9 1.7 - 2.4 mg/dL  Phosphorus     Status: None   Collection Time: 08/19/16  4:00 AM  Result Value Ref Range   Phosphorus 4.1 2.5 - 4.6 mg/dL  Basic metabolic panel     Status: Abnormal   Collection Time: 08/20/16  3:56 AM  Result Value Ref Range   Sodium 141 135 - 145 mmol/L   Potassium 3.6 3.5 - 5.1 mmol/L   Chloride 113 (H) 101 - 111 mmol/L   CO2 23 22 - 32 mmol/L   Glucose, Bld 104 (H) 65 - 99 mg/dL   BUN 7 6 - 20 mg/dL   Creatinine, Ser 0.51 0.44 - 1.00 mg/dL   Calcium 7.8 (L) 8.9 - 10.3 mg/dL  GFR calc  non Af Amer >60 >60 mL/min   GFR calc Af Amer >60 >60 mL/min    Comment: (NOTE) The eGFR has been calculated using the CKD EPI equation. This calculation has not been validated in all clinical situations. eGFR's persistently <60 mL/min signify possible Chronic Kidney Disease.    Anion gap 5 5 - 15  Magnesium     Status: None   Collection Time: 08/20/16  3:56 AM  Result Value Ref Range   Magnesium 1.8 1.7 - 2.4 mg/dL  Phosphorus     Status: None   Collection Time: 08/20/16  3:56 AM  Result Value Ref Range   Phosphorus 4.4 2.5 - 4.6 mg/dL   No components found for: ESR, C REACTIVE PROTEIN MICRO: Recent Results (from the past 720 hour(s))  Culture, blood (Routine x 2)     Status: Abnormal   Collection Time: 08/17/16  6:22 PM  Result Value Ref Range Status   Specimen Description BLOOD RIGHT ASSIST CONTROL  Final   Special Requests   Final    BOTTLES DRAWN AEROBIC AND ANAEROBIC Blood Culture adequate volume   Culture  Setup Time   Final    GRAM POSITIVE COCCI AEROBIC BOTTLE ONLY CRITICAL RESULT CALLED TO, READ BACK BY AND VERIFIED WITH: JASON ROBBINS ON 08/18/16 AT 24 MNS Performed at [facility], [place] [address] [place],  [address]    Culture STREPTOCOCCUS PNEUMONIAE (A)  Final   Report Status 08/20/2016 FINAL  Final   Organism ID, Bacteria STREPTOCOCCUS PNEUMONIAE  Final      Susceptibility   Streptococcus pneumoniae - MIC*    ERYTHROMYCIN <=0.12 SENSITIVE Sensitive     LEVOFLOXACIN 0.5 SENSITIVE Sensitive     PENICILLIN (meningitis) <=0.06 SENSITIVE Sensitive     PENICILLIN (non-meningitis) <=0.06 SENSITIVE Sensitive     CEFTRIAXONE (non-meningitis) <=0.12 SENSITIVE Sensitive     CEFTRIAXONE (meningitis) <=0.12 SENSITIVE Sensitive     * STREPTOCOCCUS PNEUMONIAE  Blood Culture ID Panel (Reflexed)     Status: Abnormal   Collection Time: 08/17/16  6:22 PM  Result Value Ref Range Status   Enterococcus species NOT DETECTED NOT DETECTED Final   Listeria  monocytogenes NOT DETECTED NOT DETECTED Final   Staphylococcus species NOT DETECTED NOT DETECTED Final   Staphylococcus aureus NOT DETECTED NOT DETECTED Final   Streptococcus species DETECTED (A) NOT DETECTED Final    Comment: CRITICAL RESULT CALLED TO, READ BACK BY AND VERIFIED WITH: JASON ROBBINS ON 08/18/16 AT 1556 MNS    Streptococcus agalactiae NOT DETECTED NOT DETECTED Final   Streptococcus pneumoniae DETECTED (A) NOT DETECTED Corrected    Comment: CRITICAL RESULT CALLED TO, READ BACK BY AND VERIFIED WITH: JASON ROBBINS ON 08/18/16 AT 1556 MNS CORRECTED ON 04/30 AT 1604: PREVIOUSLY REPORTED AS DETECTED RBV JASON ROBBINS ON 08/18/16 AT 1556 MNS    Streptococcus pyogenes NOT DETECTED NOT DETECTED Final   Acinetobacter baumannii NOT DETECTED NOT DETECTED Final   Enterobacteriaceae species NOT DETECTED NOT DETECTED Final   Enterobacter cloacae complex NOT DETECTED NOT DETECTED Final   Escherichia coli NOT DETECTED NOT DETECTED Final   Klebsiella oxytoca NOT DETECTED NOT DETECTED Final   Klebsiella pneumoniae NOT DETECTED NOT DETECTED Final   Proteus species NOT DETECTED NOT DETECTED Final   Serratia marcescens NOT DETECTED NOT DETECTED Final   Haemophilus influenzae NOT DETECTED NOT DETECTED Final   Neisseria meningitidis NOT DETECTED NOT DETECTED Final   Pseudomonas aeruginosa NOT DETECTED NOT DETECTED Final   Candida albicans  NOT DETECTED NOT DETECTED Final   Candida glabrata NOT DETECTED NOT DETECTED Final   Candida krusei NOT DETECTED NOT DETECTED Final   Candida parapsilosis NOT DETECTED NOT DETECTED Final   Candida tropicalis NOT DETECTED NOT DETECTED Final  Culture, blood (Routine x 2)     Status: None (Preliminary result)   Collection Time: 08/17/16  7:25 PM  Result Value Ref Range Status   Specimen Description BLOOD RIGHT ANTECUBITAL  Final   Special Requests   Final    BOTTLES DRAWN AEROBIC AND ANAEROBIC Blood Culture results may not be optimal due to an excessive volume  of blood received in culture bottles   Culture NO GROWTH 3 DAYS  Final   Report Status PENDING  Incomplete  Gastrointestinal Panel by PCR , Stool     Status: None   Collection Time: 08/18/16 10:25 AM  Result Value Ref Range Status   Campylobacter species NOT DETECTED NOT DETECTED Final   Plesimonas shigelloides NOT DETECTED NOT DETECTED Final   Salmonella species NOT DETECTED NOT DETECTED Final   Yersinia enterocolitica NOT DETECTED NOT DETECTED Final   Vibrio species NOT DETECTED NOT DETECTED Final   Vibrio cholerae NOT DETECTED NOT DETECTED Final   Enteroaggregative E coli (EAEC) NOT DETECTED NOT DETECTED Final   Enteropathogenic E coli (EPEC) NOT DETECTED NOT DETECTED Final   Enterotoxigenic E coli (ETEC) NOT DETECTED NOT DETECTED Final   Shiga like toxin producing E coli (STEC) NOT DETECTED NOT DETECTED Final   Shigella/Enteroinvasive E coli (EIEC) NOT DETECTED NOT DETECTED Final   Cryptosporidium NOT DETECTED NOT DETECTED Final   Cyclospora cayetanensis NOT DETECTED NOT DETECTED Final   Entamoeba histolytica NOT DETECTED NOT DETECTED Final   Giardia lamblia NOT DETECTED NOT DETECTED Final   Adenovirus F40/41 NOT DETECTED NOT DETECTED Final   Astrovirus NOT DETECTED NOT DETECTED Final   Norovirus GI/GII NOT DETECTED NOT DETECTED Final   Rotavirus A NOT DETECTED NOT DETECTED Final   Sapovirus (I, II, IV, and V) NOT DETECTED NOT DETECTED Final  C difficile quick scan w PCR reflex     Status: None   Collection Time: 08/18/16 10:25 AM  Result Value Ref Range Status   C Diff antigen NEGATIVE NEGATIVE Final   C Diff toxin NEGATIVE NEGATIVE Final   C Diff interpretation No C. difficile detected.  Final    IMAGING: Dg Chest 2 View  Result Date: 08/17/2016 CLINICAL DATA:  Fever and cough EXAM: CHEST  2 VIEW COMPARISON:  None. FINDINGS: Cardiac shadow is within normal limits. Right upper lobe infiltrate is seen. The lungs are otherwise clear. No pneumothorax or effusion is noted. No  bony abnormality is seen. IMPRESSION: Right upper lobe pneumonia. Followup PA and lateral chest X-ray is recommended in 3-4 weeks following trial of antibiotic therapy to ensure resolution and exclude underlying malignancy. Electronically Signed   By: Inez Catalina M.D.   On: 08/17/2016 19:24   Ct Soft Tissue Neck W Contrast  Result Date: 08/18/2016 CLINICAL DATA:  51 year old female with fever and abdominal pain. Extensive abdominal and pelvis lymphadenopathy suspicious for lymphoproliferative disorder or other neoplastic process. EXAM: CT NECK WITH CONTRAST TECHNIQUE: Multidetector CT imaging of the neck was performed using the standard protocol following the bolus administration of intravenous contrast. CONTRAST:  13m ISOVUE-300 IOPAMIDOL (ISOVUE-300) INJECTION 61% in conjunction with contrast enhanced imaging of the chest reported separately. COMPARISON:  Chest CT today reported separately. CT Abdomen and Pelvis 08/17/2016 FINDINGS: Pharynx and larynx: Larynx and pharynx soft  tissue contours are within normal limits. Negative parapharyngeal spaces. Negative retropharyngeal space aside from small but conspicuous retropharyngeal lymph nodes (e.g. Series 3, image 28 on the right). Salivary glands: Negative sublingual space. Negative submandibular glands and parotid glands. Bilateral parotid space lymph nodes are small but mildly increased in number. Thyroid: Subcentimeter hypodense nodule in the left lobe does not meet consensus criteria for ultrasound follow-up. Negative isthmus and right lobe. Lymph nodes: Increased number of primarily subcentimeter lymph nodes throughout the bilateral neck nodal stations. The largest nodes at the bilateral level IIa and level 1b stations measure up to 10 mm short axis but have maintained fatty hila. There are also 10-11 mm short axis lymph nodes at the left level 4 station. No cystic or necrotic nodes identified. Vascular: Major vascular structures in the neck and at the  skullbase are patent. Limited intracranial: Negative. Visualized orbits: Negative. Mastoids and visualized paranasal sinuses: Small left maxillary mucous retention cyst. Minimal anterior right ethmoid sinus mucosal thickening. Otherwise clear. Skeleton: No acute or suspicious osseous lesion in the neck. Upper chest: Chest CT today is reported separately. IMPRESSION: 1. Increased number of predominantly subcentimeter lymph nodes throughout the bilateral neck nodal stations. Favor Leukemia. Other lymphoproliferative disorder, metastatic process, or infectious/reactive nodes are less likely. 2. Abnormal chest CT findings today are reported separately. Electronically Signed   By: Genevie Ann M.D.   On: 08/18/2016 11:58   Ct Chest W Contrast  Result Date: 08/18/2016 CLINICAL DATA:  Community acquired pneumonia. Fever. Abdominal discomfort and cough. EXAM: CT CHEST WITH CONTRAST TECHNIQUE: Multidetector CT imaging of the chest was performed during intravenous contrast administration. CONTRAST:  63m ISOVUE-300 IOPAMIDOL (ISOVUE-300) INJECTION 61% COMPARISON:  None. FINDINGS: Cardiovascular: Normal heart size.  No pericardial effusion. Mediastinum/Nodes: The trachea appears patent and is midline. Unremarkable appearance of the esophagus. Enlarged mediastinal lymph nodes identified. Index right paratracheal node measures 1.4 cm, image 48 of series 3. Index sub- carinal lymph node measures 1.6 cm, image 67 of series 3. Left hilar lymph node measures 11 mm, image 67 of series 3. Prominent bilateral axillary lymph nodes identified. Index right axillary node measures 1.6 cm, image 39 of series 3. Index left axillary lymph node measures 1 cm, image 19 of series 3. Lungs/Pleura: Small right pleural effusion. Dense interstitial and airspace consolidation involving the right upper lobe identified. Upper Abdomen: No acute abnormality. Musculoskeletal: No chest wall abnormality. No acute or significant osseous findings. IMPRESSION:  1. Dense right upper lobe airspace consolidation is identified compatible with the clinical history of community acquired pneumonia. 2. Enlarged bilateral axillary, mediastinal and sub- carinal lymph nodes. Findings may be compatible with clinically suspected lymphoma. Correlation with tissue sampling advise. 3. Small right pleural effusion. Electronically Signed   By: TKerby MoorsM.D.   On: 08/18/2016 11:52   Ct Abdomen Pelvis W Contrast  Result Date: 08/17/2016 CLINICAL DATA:  Fever, diarrhea, fatigue and anorexia for 6 days. EXAM: CT ABDOMEN AND PELVIS WITH CONTRAST TECHNIQUE: Multidetector CT imaging of the abdomen and pelvis was performed using the standard protocol following bolus administration of intravenous contrast. CONTRAST:  10110mISOVUE-300 IOPAMIDOL (ISOVUE-300) INJECTION 61% COMPARISON:  None. FINDINGS: Lower chest: Consolidation in the right middle lobe base, continuing above the upper limit of this study. Inferior mediastinal adenopathy measuring at least 2 cm short axis just to the right of the distal thoracic esophagus. Hepatobiliary: No focal liver abnormality is seen. No gallstones, gallbladder wall thickening, or biliary dilatation. Pancreas: Unremarkable. No pancreatic ductal dilatation or  surrounding inflammatory changes. Spleen: No focal splenic lesions. The spleen is mildly enlarged, measuring 9.0 by 1.5 x 1.4 cm. Adrenals/Urinary Tract: Adrenal glands are unremarkable. Kidneys are normal, without renal calculi, focal lesion, or hydronephrosis. Bladder is unremarkable. Stomach/Bowel: Stomach is within normal limits. Appendix appears normal. No evidence of bowel wall thickening, distention, or inflammatory changes. Vascular/Lymphatic: The abdominal aorta is normal in caliber with mild atherosclerotic calcification. There is extensive adenopathy, with greatest involvement in the retroperitoneum. There is a 10 mm short axis aortocaval node on series 2, image 32 and a 1.4 cm celiac node  to the left of midline on series 2, image 32. There is a 12 mm retrocaval node on series 2 image 48. There are multiple para-aortic nodes measuring up to 1.2 cm. There is a left common iliac node measuring 1.3 cm on series 2, image 60. There is a 1.5 cm node adjacent to the left iliac bifurcation on series 2, image 66. There are multiple iliac nodes throughout the pelvis. There are bilateral obturator internus nodes measuring up to 1.6 cm on series 2, image 80. Reproductive: Uterus and bilateral adnexa are unremarkable. Other: No focal inflammation.  No ascites. Musculoskeletal: No acute or significant osseous findings. IMPRESSION: 1. Extensive adenopathy throughout the abdomen and pelvis. This is concerning for a neoplastic process such as lymphoma. 2. Mild splenomegaly.  No focal splenic lesion. 3. Included portions of the lower chest demonstrate inferior mediastinal adenopathy and right middle lobe base lung consolidation. Consider chest CT for evaluation of extent of disease. 4. Consider tissue sampling for diagnosis. If there are no palpable nodes, many of the abdomen/pelvic nodes are amenable to CT-guided biopsy. 5. These results will be called to the ordering clinician or representative by the Radiologist Assistant, and communication documented in the PACS or zVision Dashboard. Electronically Signed   By: Andreas Newport M.D.   On: 08/17/2016 21:28   US Biopsy  Result Date: 08/19/2016 INDICATION: Diffuse adenopathy EXAM: ULTRASOUND GUIDED CORE BIOPSY OF LEFT SUPRACLAVICULAR ADENOPATHY MEDICATIONS: 1% LIDOCAINE LOCALLY ANESTHESIA/SEDATION: Versed 1.68m IV; Fentanyl 51m IV; Moderate Sedation Time:  11 MINUTES The patient was continuously monitored during the procedure by the interventional radiology nurse under my direct supervision. FLUOROSCOPY TIME:  Fluoroscopy Time: None. COMPLICATIONS: None immediate. PROCEDURE: The procedure, risks, benefits, and alternatives were explained to the patient.  Questions regarding the procedure were encouraged and answered. The patient understands and consents to the procedure. The left neck was prepped with chloroprep in a sterile fashion, and a sterile drape was applied covering the operative field. A sterile gown and sterile gloves were used for the procedure. Local anesthesia was provided with 1% Lidocaine. Previous imaging reviewed. Preliminary ultrasound performed. Left supraclavicular adenopathy localized. Overlying skin marked. Under sterile conditions and local anesthesia, an 18 gauge core biopsy was advanced to the left supraclavicular adenopathy. Several 18 gauge core biopsies obtained. Samples were placed on a saline moist Telfa. No immediate complication.  Patient tolerated the biopsy well. FINDINGS: Imaging confirms needle placement to the left supraclavicular adenopathy for core biopsy IMPRESSION: Successful ultrasound left supraclavicular adenopathy 18 gauge core biopsy Electronically Signed   By: M.Jerilynn Mages Shick M.D.   On: 08/19/2016 12:06    Assessment:   ReKella Splinters a [age].o. female previously healthy admitted with one week of illness with fevers, chills and cough. Found to have Strep Pna bacteremia and PNA on imaging along with diffuse LAN. HIV negative. LN bxp pending. No Tb contacts, travel, pets.   Recommendations  Continue ceftriaxone but at DC can send home on amoxicillin 500 mg TID for total 14 day course.  Await path results Thank you very much for allowing me to participate in the care of this patient. Please call with questions.   Cheral Marker. Ola Spurr, MD

## 2016-08-21 ENCOUNTER — Encounter: Payer: Self-pay | Admitting: Internal Medicine

## 2016-08-21 ENCOUNTER — Telehealth: Payer: Self-pay | Admitting: Internal Medicine

## 2016-08-21 LAB — SURGICAL PATHOLOGY

## 2016-08-21 LAB — BASIC METABOLIC PANEL
Anion gap: 7 (ref 5–15)
BUN: 8 mg/dL (ref 6–20)
CHLORIDE: 111 mmol/L (ref 101–111)
CO2: 23 mmol/L (ref 22–32)
CREATININE: 0.51 mg/dL (ref 0.44–1.00)
Calcium: 8 mg/dL — ABNORMAL LOW (ref 8.9–10.3)
GFR calc Af Amer: >60 mL/min (ref >60)
Glucose, Bld: 92 mg/dL (ref 65–99)
Potassium: 4 mmol/L (ref 3.5–5.1)
SODIUM: 141 mmol/L (ref 135–145)

## 2016-08-21 LAB — COMP PANEL: LEUKEMIA/LYMPHOMA

## 2016-08-21 MED ORDER — ENSURE ENLIVE PO LIQD: 237.0000 mL | ORAL | 1 refills | Status: AC

## 2016-08-21 MED ORDER — SENNOSIDES-DOCUSATE SODIUM 8.6-50 MG PO TABS: 1.0000 | ORAL_TABLET | Freq: Every evening | ORAL | Status: AC | PRN

## 2016-08-21 MED ORDER — ACETAMINOPHEN 325 MG PO TABS: 650.0000 mg | ORAL_TABLET | Freq: Four times a day (QID) | ORAL | Status: AC | PRN

## 2016-08-21 MED ORDER — GUAIFENESIN-DM 100-10 MG/5ML PO SYRP: 5.0000 mL | ORAL_SOLUTION | ORAL | 0 refills | Status: AC | PRN

## 2016-08-21 MED ORDER — CITALOPRAM HYDROBROMIDE 10 MG PO TABS: 10.0000 mg | ORAL_TABLET | Freq: Every day | ORAL | 0 refills | Status: AC

## 2016-08-21 MED ORDER — AMOXICILLIN 500 MG PO CAPS: 500.0000 mg | ORAL_CAPSULE | Freq: Three times a day (TID) | ORAL | 0 refills | Status: AC

## 2016-08-21 NOTE — Progress Notes (Signed)
Pt called out verbalizing that she had found her [place] Driver's license loose in her pocketbook; pt calling her friend to come and pick her up for discharge; verbally adivsed pt to let us know when her friend arrived in order to get a wheelchair for discharge

## 2016-08-21 NOTE — Progress Notes (Signed)
MEDICATION RELATED CONSULT NOTE - INITIAL   Pharmacy Consult for electrolyte replacement/monitoring Indication: hypokalemia  No Known Allergies  Patient Measurements: Height: 5\' 4"  (162.6 cm) Weight: 206 lb 8 oz (93.7 kg) IBW/kg (Calculated) : 54.7  Vital Signs: Temp: 98 F (36.7 C) (05/03 0354) Temp Source: Oral (05/03 0354) BP: 133/79 (05/03 0354) Pulse Rate: 104 (05/03 0354) Intake/Output from previous day: 05/02 0701 - 05/03 0700 In: 2101.3 [P.O.:360; I.V.:1691.3; IV Piggyback:50] Out: -  Intake/Output from this shift: No intake/output data recorded.  Labs:  Recent Labs  08/18/16 0842  08/19/16 0400 08/20/16 0356 08/21/16 0459  WBC  --   --  19.9*  --   --   HGB  --   --  9.5*  --   --   HCT  --   --  28.8*  --   --   PLT  --   --  308  --   --   CREATININE  --   < > 0.61 0.51 0.51  MG 2.0  --  1.9 1.8  --   PHOS 3.6  --  4.1 4.4  --   < > = values in this interval not displayed. Estimated Creatinine Clearance: 90.3 mL/min (by C-G formula based on SCr of 0.51 mg/dL).   Medical History: History reviewed. No pertinent past medical history.  Medications:  Infusions:  . sodium chloride Stopped (08/19/16 1226)  . 0.9 % NaCl with KCl 20 mEq / L 75 mL/hr at 08/21/16 0558  . cefTRIAXone (ROCEPHIN)  IV Stopped (08/20/16 0912)    Assessment: 42 yof cc fever with no significant PMH admitted for PNA and lymphadenopathy work up. Low electrolytes (K 2.9) noted on AM labs; pharmacy was consulted to monitor and replace electrolytes.  Goal of Therapy:  Electrolytes WNL  Plan:  K = 4.4 this evening after supplementation. No additional supplementation needed at this time, will recheck electrolytes with AM labs tomorrow.  5/1 0400 K 4.2, Ca 8, albumin 2, adjusted Ca 9.6, Mg 1.9, phos 4.1. No further supplement warranted. Will recheck electrolytes tomorrow with AM labs.   5/2 0356 K 3.6, Ca 7.8, albumin 2, adjusted Ca 9.4, Mg 1.8, phos 4.4. Will give potassium chloride  40 mEq PO x 1 and recheck BMP tomorrow with AM labs.  5/3 0459 K 4, Ca 8, albumin 2, adjusted Ca 9.6, Mg not assessed, phos not assessed. No further supplement warranted at this time. Will recheck all electrolytes tomorrow with AM labs.   Laural Benes, Pharm.D., BCPS Clinical Pharmacist 08/21/2016,7:32 AM

## 2016-08-21 NOTE — Progress Notes (Signed)
I agree with previously documented [place]  documented by Hurman Horn RN for said pt this shift

## 2016-08-21 NOTE — Telephone Encounter (Signed)
Pt has  follow up with me in [place] on may 8th.

## 2016-08-21 NOTE — Progress Notes (Signed)
Sign off report given to the primary nurse Robyn.    Sheryl Mitchell

## 2016-08-21 NOTE — Care Management (Signed)
Provided applications to [facility].  Faxed discharge prescriptions to [facility].  All scripts but Celexa and Amoxil are over the counter.  Explained this to patient.  Provided her coupons for Celexa for [employer] [employer] and [employer] for 7 - 9 dollars.  Discussed there are no refills on the medications and must expedite efforts to complete the applications. Update primary nurse.  Instructed patient to proceed to [facility] at discharge to obtain her medications

## 2016-08-21 NOTE — Discharge Summary (Addendum)
[facility] [place] NAME: Sheryl Mitchell    MR#:  [id number]  DATE OF BIRTH:  [birth year]  DATE OF ADMISSION:  08/17/2016 ADMITTING PHYSICIAN: Saundra Shelling, MD  DATE OF DISCHARGE: 08/21/2016 PRIMARY CARE PHYSICIAN: No PCP Per Patient    ADMISSION DIAGNOSIS:  Lymphadenopathy [R59.1] Sepsis, due to unspecified organism ([place]) [A41.9] Pneumonia of right upper lobe due to infectious organism ([place]) [J18.1]  DISCHARGE DIAGNOSIS:  Active Problems:   CAP (community acquired pneumonia)   SECONDARY DIAGNOSIS:  History reviewed. No pertinent past medical history.  HOSPITAL COURSE:   HISTORY OF PRESENT ILLNESS: Sheryl Mitchell  is a 51 y.o. female with no past medical history history presented to the emergency room with fever for the last couple of days. She also has some cough is productive of phlegm. No complaints of any chest pain, shortness of breath. She also had an episode of diarrhea. Patient was evaluated in the emergency room she was found to have pneumonia on x-ray and also she was worked up with CT abdomen because as she had elevated RBC count. Extensive lymphadenopathy noted on the CT abdomen. Hospitalist service was consulted for the care of the patient. No recent travel or sick contacts at home. No headache, dizziness and blurry vision. Patient says she has a lot of weakness and fatigue and also decreased appetite.   *Strep pneumo bacteremia -Likely source lung -Patient is treated with IV Rocephin and azithromycin. Infectious disease is recommending to discharge patient with amoxicillin 500 mg by mouth 3 times a day for 14 days -Appreciate ID recommendations  *Community-acquired pneumonia: Patient has received IV Rocephin and Zithromax during the hospital course and clinically improved. We'll discharge patient with amoxicillin 500 mg for 14 days - CT chest confirmed same  *Extensive abdominal/neck lymphadenopathy:  Concerning for lymphoma  - Status post ultrasound-guided left supraclavicular Biopsy -pending results. Outpatient follow-up with oncology as recommended - * Diarrhea - Stool for C. difficile negative - Other stool studies negative  *Leukocytosis: Improving with antibiotics   * Severe Hypokalemia - Repleted and resolved  *Abnormal liver function tests - Resolved with IV hydration    * Anxiety  patient failed on Zoloft before she wants to try Celexa will start her on citalopram 10 mg by mouth once daily  DISCHARGE CONDITIONS:   stable  CONSULTS OBTAINED:  Treatment Team:  Cammie Sickle, MD Leonel Ramsay, MD   PROCEDURES  ultrasound-guided left supraclavicular Biopsy  DRUG ALLERGIES:  No Known Allergies  DISCHARGE MEDICATIONS:   Current Discharge Medication List    START taking these medications   Details  acetaminophen (TYLENOL) 325 MG tablet Take 2 tablets (650 mg total) by mouth every 6 (six) hours as needed for mild pain (or Fever >/= 101).    amoxicillin (AMOXIL) 500 MG capsule Take 1 capsule (500 mg total) by mouth 3 (three) times daily. Qty: 42 capsule, Refills: 0    citalopram (CELEXA) 10 MG tablet Take 1 tablet (10 mg total) by mouth daily. Qty: 30 tablet, Refills: 0    feeding supplement, ENSURE ENLIVE, (ENSURE ENLIVE) LIQD Take 237 mLs by mouth daily. Qty: 30 Bottle, Refills: 1    guaiFENesin-dextromethorphan (ROBITUSSIN DM) 100-10 MG/5ML syrup Take 5 mLs by mouth every 4 (four) hours as needed for cough (chest congestion). Qty: 118 mL, Refills: 0    senna-docusate (SENOKOT-S) 8.6-50 MG tablet Take 1 tablet by mouth at bedtime as needed for mild constipation.  DISCHARGE INSTRUCTIONS:   Follow-up with primary care physician in a week Follow-up with oncology Dr. Seward Meth in a week  DIET:  Regular diet  DISCHARGE CONDITION:  Stable  ACTIVITY:  Activity as tolerated  OXYGEN:  Home Oxygen: No.   Oxygen Delivery:  room air  DISCHARGE LOCATION:  home   If you experience worsening of your admission symptoms, develop shortness of breath, life threatening emergency, suicidal or homicidal thoughts you must seek medical attention immediately by calling 911 or calling your MD immediately  if symptoms less severe.  You Must read complete instructions/literature along with all the possible adverse reactions/side effects for all the Medicines you take and that have been prescribed to you. Take any new Medicines after you have completely understood and accpet all the possible adverse reactions/side effects.   Please note  You were cared for by a hospitalist during your hospital stay. If you have any questions about your discharge medications or the care you received while you were in the hospital after you are discharged, you can call the unit and asked to speak with the hospitalist on call if the hospitalist that took care of you is not available. Once you are discharged, your primary care physician will handle any further medical issues. Please note that NO REFILLS for any discharge medications will be authorized once you are discharged, as it is imperative that you return to your primary care physician (or establish a relationship with a primary care physician if you do not have one) for your aftercare needs so that they can reassess your need for medications and monitor your lab values.     Today  Chief Complaint  Patient presents with  . Fever   Patient is feeling fine. No fever for 24 hours. Denies any shortness of breath. Feels comfortable to go home. Okay to discharge patient from oncology and ID standpoint  ROS:  CONSTITUTIONAL: Denies fevers, chills. Denies any fatigue, weakness.  EYES: Denies blurry vision, double vision, eye pain. EARS, NOSE, THROAT: Denies tinnitus, ear pain, hearing loss. RESPIRATORY: Denies cough, wheeze, shortness of breath.  CARDIOVASCULAR: Denies chest pain, palpitations,  edema.  GASTROINTESTINAL: Denies nausea, vomiting, diarrhea, abdominal pain. Denies bright red blood per rectum. GENITOURINARY: Denies dysuria, hematuria. ENDOCRINE: Denies nocturia or thyroid problems. HEMATOLOGIC AND LYMPHATIC: Denies easy bruising or bleeding. SKIN: Denies rash or lesion. MUSCULOSKELETAL: Denies pain in neck, back, shoulder, knees, hips or arthritic symptoms.  NEUROLOGIC: Denies paralysis, paresthesias.  PSYCHIATRIC: Denies anxiety or depressive symptoms.   VITAL SIGNS:  Blood pressure (!) 149/95, pulse 99, temperature 98.4 F (36.9 C), temperature source Oral, resp. rate 14, height 5\' 4"  (1.626 m), weight 93.7 kg (206 lb 8 oz), SpO2 97 %.  I/O:    Intake/Output Summary (Last 24 hours) at 08/21/16 1131 Last data filed at 08/21/16 1128  Gross per 24 hour  Intake           5407.2 ml  Output                0 ml  Net           5407.2 ml    PHYSICAL EXAMINATION:  GENERAL:  51 y.o.-year-old patient lying in the bed with no acute distress.  EYES: Pupils equal, round, reactive to light and accommodation. No scleral icterus. Extraocular muscles intact.  HEENT: Head atraumatic, normocephalic. Oropharynx and nasopharynx clear.  NECK:  Supple, no jugular venous distention. No thyroid enlargement, no tenderness.  LUNGS: Normal breath sounds bilaterally, no  wheezing, rales,rhonchi or crepitation. No use of accessory muscles of respiration.  CARDIOVASCULAR: S1, S2 normal. No murmurs, rubs, or gallops.  ABDOMEN: Soft, non-tender, non-distended. Bowel sounds present. No organomegaly or mass.  EXTREMITIES: No pedal edema, cyanosis, or clubbing.  NEUROLOGIC: Cranial nerves II through XII are intact. Muscle strength 5/5 in all extremities. Sensation intact. Gait not checked.  PSYCHIATRIC: The patient is alert and oriented x 3.  SKIN: No obvious rash, lesion, or ulcer.   DATA REVIEW:   CBC  Recent Labs Lab 08/19/16 0400  WBC 19.9*  HGB 9.5*  HCT 28.8*  PLT 308     Chemistries   Recent Labs Lab 08/18/16 0454  08/20/16 0356 08/21/16 0459  NA 139  < > 141 141  K 2.9*  < > 3.6 4.0  CL 108  < > 113* 111  CO2 24  < > 23 23  GLUCOSE 97  < > 104* 92  BUN 8  < > 7 8  CREATININE 0.76  < > 0.51 0.51  CALCIUM 7.5*  < > 7.8* 8.0*  MG  --   < > 1.8  --   AST 33  --   --   --   ALT 47  --   --   --   ALKPHOS 113  --   --   --   BILITOT 0.3  --   --   --   < > = values in this interval not displayed.  Cardiac Enzymes No results for input(s): TROPONINI in the last 168 hours.  Microbiology Results  Results for orders placed or performed during the hospital encounter of 08/17/16  Culture, blood (Routine x 2)     Status: Abnormal   Collection Time: 08/17/16  6:22 PM  Result Value Ref Range Status   Specimen Description BLOOD RIGHT ASSIST CONTROL  Final   Special Requests   Final    BOTTLES DRAWN AEROBIC AND ANAEROBIC Blood Culture adequate volume   Culture  Setup Time   Final    GRAM POSITIVE COCCI AEROBIC BOTTLE ONLY CRITICAL RESULT CALLED TO, READ BACK BY AND VERIFIED WITH: JASON ROBBINS ON 08/18/16 AT 71 MNS Performed at [facility], [place] [address] [place], [place] [address]    Culture STREPTOCOCCUS PNEUMONIAE (A)  Final   Report Status 08/20/2016 FINAL  Final   Organism ID, Bacteria STREPTOCOCCUS PNEUMONIAE  Final      Susceptibility   Streptococcus pneumoniae - MIC*    ERYTHROMYCIN <=0.12 SENSITIVE Sensitive     LEVOFLOXACIN 0.5 SENSITIVE Sensitive     PENICILLIN (meningitis) <=0.06 SENSITIVE Sensitive     PENICILLIN (non-meningitis) <=0.06 SENSITIVE Sensitive     CEFTRIAXONE (non-meningitis) <=0.12 SENSITIVE Sensitive     CEFTRIAXONE (meningitis) <=0.12 SENSITIVE Sensitive     * STREPTOCOCCUS PNEUMONIAE  Blood Culture ID Panel (Reflexed)     Status: Abnormal   Collection Time: 08/17/16  6:22 PM  Result Value Ref Range Status   Enterococcus species NOT DETECTED NOT DETECTED Final   Listeria monocytogenes NOT DETECTED NOT  DETECTED Final   Staphylococcus species NOT DETECTED NOT DETECTED Final   Staphylococcus aureus NOT DETECTED NOT DETECTED Final   Streptococcus species DETECTED (A) NOT DETECTED Final    Comment: CRITICAL RESULT CALLED TO, READ BACK BY AND VERIFIED WITH: JASON ROBBINS ON 08/18/16 AT 1556 MNS    Streptococcus agalactiae NOT DETECTED NOT DETECTED Final   Streptococcus pneumoniae DETECTED (A) NOT DETECTED Corrected    Comment: CRITICAL  RESULT CALLED TO, READ BACK BY AND VERIFIED WITH: JASON ROBBINS ON 08/18/16 AT 68 MNS CORRECTED ON 04/30 AT 1604: PREVIOUSLY REPORTED AS DETECTED RBV JASON ROBBINS ON 08/18/16 AT 50 MNS    Streptococcus pyogenes NOT DETECTED NOT DETECTED Final   Acinetobacter baumannii NOT DETECTED NOT DETECTED Final   Enterobacteriaceae species NOT DETECTED NOT DETECTED Final   Enterobacter cloacae complex NOT DETECTED NOT DETECTED Final   Escherichia coli NOT DETECTED NOT DETECTED Final   Klebsiella oxytoca NOT DETECTED NOT DETECTED Final   Klebsiella pneumoniae NOT DETECTED NOT DETECTED Final   Proteus species NOT DETECTED NOT DETECTED Final   Serratia marcescens NOT DETECTED NOT DETECTED Final   Haemophilus influenzae NOT DETECTED NOT DETECTED Final   Neisseria meningitidis NOT DETECTED NOT DETECTED Final   Pseudomonas aeruginosa NOT DETECTED NOT DETECTED Final   Candida albicans NOT DETECTED NOT DETECTED Final   Candida glabrata NOT DETECTED NOT DETECTED Final   Candida krusei NOT DETECTED NOT DETECTED Final   Candida parapsilosis NOT DETECTED NOT DETECTED Final   Candida tropicalis NOT DETECTED NOT DETECTED Final  Culture, blood (Routine x 2)     Status: None (Preliminary result)   Collection Time: 08/17/16  7:25 PM  Result Value Ref Range Status   Specimen Description BLOOD RIGHT ANTECUBITAL  Final   Special Requests   Final    BOTTLES DRAWN AEROBIC AND ANAEROBIC Blood Culture results may not be optimal due to an excessive volume of blood received in culture  bottles   Culture NO GROWTH 4 DAYS  Final   Report Status PENDING  Incomplete  Gastrointestinal Panel by PCR , Stool     Status: None   Collection Time: 08/18/16 10:25 AM  Result Value Ref Range Status   Campylobacter species NOT DETECTED NOT DETECTED Final   Plesimonas shigelloides NOT DETECTED NOT DETECTED Final   Salmonella species NOT DETECTED NOT DETECTED Final   Yersinia enterocolitica NOT DETECTED NOT DETECTED Final   Vibrio species NOT DETECTED NOT DETECTED Final   Vibrio cholerae NOT DETECTED NOT DETECTED Final   Enteroaggregative E coli (EAEC) NOT DETECTED NOT DETECTED Final   Enteropathogenic E coli (EPEC) NOT DETECTED NOT DETECTED Final   Enterotoxigenic E coli (ETEC) NOT DETECTED NOT DETECTED Final   Shiga like toxin producing E coli (STEC) NOT DETECTED NOT DETECTED Final   Shigella/Enteroinvasive E coli (EIEC) NOT DETECTED NOT DETECTED Final   Cryptosporidium NOT DETECTED NOT DETECTED Final   Cyclospora cayetanensis NOT DETECTED NOT DETECTED Final   Entamoeba histolytica NOT DETECTED NOT DETECTED Final   Giardia lamblia NOT DETECTED NOT DETECTED Final   Adenovirus F40/41 NOT DETECTED NOT DETECTED Final   Astrovirus NOT DETECTED NOT DETECTED Final   Norovirus GI/GII NOT DETECTED NOT DETECTED Final   Rotavirus A NOT DETECTED NOT DETECTED Final   Sapovirus (I, II, IV, and V) NOT DETECTED NOT DETECTED Final  C difficile quick scan w PCR reflex     Status: None   Collection Time: 08/18/16 10:25 AM  Result Value Ref Range Status   C Diff antigen NEGATIVE NEGATIVE Final   C Diff toxin NEGATIVE NEGATIVE Final   C Diff interpretation No C. difficile detected.  Final    RADIOLOGY:  Dg Chest 2 View  Result Date: 08/17/2016 CLINICAL DATA:  Fever and cough EXAM: CHEST  2 VIEW COMPARISON:  None. FINDINGS: Cardiac shadow is within normal limits. Right upper lobe infiltrate is seen. The lungs are otherwise clear. No pneumothorax or effusion is  noted. No bony abnormality is seen.  IMPRESSION: Right upper lobe pneumonia. Followup PA and lateral chest X-ray is recommended in 3-4 weeks following trial of antibiotic therapy to ensure resolution and exclude underlying malignancy. Electronically Signed   By: Inez Catalina M.D.   On: 08/17/2016 19:24   Ct Soft Tissue Neck W Contrast  Result Date: 08/18/2016 CLINICAL DATA:  51 year old female with fever and abdominal pain. Extensive abdominal and pelvis lymphadenopathy suspicious for lymphoproliferative disorder or other neoplastic process. EXAM: CT NECK WITH CONTRAST TECHNIQUE: Multidetector CT imaging of the neck was performed using the standard protocol following the bolus administration of intravenous contrast. CONTRAST:  57mL ISOVUE-300 IOPAMIDOL (ISOVUE-300) INJECTION 61% in conjunction with contrast enhanced imaging of the chest reported separately. COMPARISON:  Chest CT today reported separately. CT Abdomen and Pelvis 08/17/2016 FINDINGS: Pharynx and larynx: Larynx and pharynx soft tissue contours are within normal limits. Negative parapharyngeal spaces. Negative retropharyngeal space aside from small but conspicuous retropharyngeal lymph nodes (e.g. Series 3, image 28 on the right). Salivary glands: Negative sublingual space. Negative submandibular glands and parotid glands. Bilateral parotid space lymph nodes are small but mildly increased in number. Thyroid: Subcentimeter hypodense nodule in the left lobe does not meet consensus criteria for ultrasound follow-up. Negative isthmus and right lobe. Lymph nodes: Increased number of primarily subcentimeter lymph nodes throughout the bilateral neck nodal stations. The largest nodes at the bilateral level IIa and level 1b stations measure up to 10 mm short axis but have maintained fatty hila. There are also 10-11 mm short axis lymph nodes at the left level 4 station. No cystic or necrotic nodes identified. Vascular: Major vascular structures in the neck and at the skullbase are patent. Limited  intracranial: Negative. Visualized orbits: Negative. Mastoids and visualized paranasal sinuses: Small left maxillary mucous retention cyst. Minimal anterior right ethmoid sinus mucosal thickening. Otherwise clear. Skeleton: No acute or suspicious osseous lesion in the neck. Upper chest: Chest CT today is reported separately. IMPRESSION: 1. Increased number of predominantly subcentimeter lymph nodes throughout the bilateral neck nodal stations. Favor Leukemia. Other lymphoproliferative disorder, metastatic process, or infectious/reactive nodes are less likely. 2. Abnormal chest CT findings today are reported separately. Electronically Signed   By: Genevie Ann M.D.   On: 08/18/2016 11:58   Ct Chest W Contrast  Result Date: 08/18/2016 CLINICAL DATA:  Community acquired pneumonia. Fever. Abdominal discomfort and cough. EXAM: CT CHEST WITH CONTRAST TECHNIQUE: Multidetector CT imaging of the chest was performed during intravenous contrast administration. CONTRAST:  79mL ISOVUE-300 IOPAMIDOL (ISOVUE-300) INJECTION 61% COMPARISON:  None. FINDINGS: Cardiovascular: Normal heart size.  No pericardial effusion. Mediastinum/Nodes: The trachea appears patent and is midline. Unremarkable appearance of the esophagus. Enlarged mediastinal lymph nodes identified. Index right paratracheal node measures 1.4 cm, image 48 of series 3. Index sub- carinal lymph node measures 1.6 cm, image 67 of series 3. Left hilar lymph node measures 11 mm, image 67 of series 3. Prominent bilateral axillary lymph nodes identified. Index right axillary node measures 1.6 cm, image 39 of series 3. Index left axillary lymph node measures 1 cm, image 19 of series 3. Lungs/Pleura: Small right pleural effusion. Dense interstitial and airspace consolidation involving the right upper lobe identified. Upper Abdomen: No acute abnormality. Musculoskeletal: No chest wall abnormality. No acute or significant osseous findings. IMPRESSION: 1. Dense right upper lobe  airspace consolidation is identified compatible with the clinical history of community acquired pneumonia. 2. Enlarged bilateral axillary, mediastinal and sub- carinal lymph nodes. Findings may be compatible  with clinically suspected lymphoma. Correlation with tissue sampling advise. 3. Small right pleural effusion. Electronically Signed   By: Kerby Moors M.D.   On: 08/18/2016 11:52   Ct Abdomen Pelvis W Contrast  Result Date: 08/17/2016 CLINICAL DATA:  Fever, diarrhea, fatigue and anorexia for 6 days. EXAM: CT ABDOMEN AND PELVIS WITH CONTRAST TECHNIQUE: Multidetector CT imaging of the abdomen and pelvis was performed using the standard protocol following bolus administration of intravenous contrast. CONTRAST:  128mL ISOVUE-300 IOPAMIDOL (ISOVUE-300) INJECTION 61% COMPARISON:  None. FINDINGS: Lower chest: Consolidation in the right middle lobe base, continuing above the upper limit of this study. Inferior mediastinal adenopathy measuring at least 2 cm short axis just to the right of the distal thoracic esophagus. Hepatobiliary: No focal liver abnormality is seen. No gallstones, gallbladder wall thickening, or biliary dilatation. Pancreas: Unremarkable. No pancreatic ductal dilatation or surrounding inflammatory changes. Spleen: No focal splenic lesions. The spleen is mildly enlarged, measuring 9.0 by 1.5 x 1.4 cm. Adrenals/Urinary Tract: Adrenal glands are unremarkable. Kidneys are normal, without renal calculi, focal lesion, or hydronephrosis. Bladder is unremarkable. Stomach/Bowel: Stomach is within normal limits. Appendix appears normal. No evidence of bowel wall thickening, distention, or inflammatory changes. Vascular/Lymphatic: The abdominal aorta is normal in caliber with mild atherosclerotic calcification. There is extensive adenopathy, with greatest involvement in the retroperitoneum. There is a 10 mm short axis aortocaval node on series 2, image 32 and a 1.4 cm celiac node to the left of midline on  series 2, image 32. There is a 12 mm retrocaval node on series 2 image 48. There are multiple para-aortic nodes measuring up to 1.2 cm. There is a left common iliac node measuring 1.3 cm on series 2, image 60. There is a 1.5 cm node adjacent to the left iliac bifurcation on series 2, image 66. There are multiple iliac nodes throughout the pelvis. There are bilateral obturator internus nodes measuring up to 1.6 cm on series 2, image 80. Reproductive: Uterus and bilateral adnexa are unremarkable. Other: No focal inflammation.  No ascites. Musculoskeletal: No acute or significant osseous findings. IMPRESSION: 1. Extensive adenopathy throughout the abdomen and pelvis. This is concerning for a neoplastic process such as lymphoma. 2. Mild splenomegaly.  No focal splenic lesion. 3. Included portions of the lower chest demonstrate inferior mediastinal adenopathy and right middle lobe base lung consolidation. Consider chest CT for evaluation of extent of disease. 4. Consider tissue sampling for diagnosis. If there are no palpable nodes, many of the abdomen/pelvic nodes are amenable to CT-guided biopsy. 5. These results will be called to the ordering clinician or representative by the Radiologist Assistant, and communication documented in the PACS or zVision Dashboard. Electronically Signed   By: Andreas Newport M.D.   On: 08/17/2016 21:28   US Biopsy  Result Date: 08/19/2016 INDICATION: Diffuse adenopathy EXAM: ULTRASOUND GUIDED CORE BIOPSY OF LEFT SUPRACLAVICULAR ADENOPATHY MEDICATIONS: 1% LIDOCAINE LOCALLY ANESTHESIA/SEDATION: Versed 1.0mg  IV; Fentanyl 23mcg IV; Moderate Sedation Time:  11 MINUTES The patient was continuously monitored during the procedure by the interventional radiology nurse under my direct supervision. FLUOROSCOPY TIME:  Fluoroscopy Time: None. COMPLICATIONS: None immediate. PROCEDURE: The procedure, risks, benefits, and alternatives were explained to the patient. Questions regarding the procedure  were encouraged and answered. The patient understands and consents to the procedure. The left neck was prepped with chloroprep in a sterile fashion, and a sterile drape was applied covering the operative field. A sterile gown and sterile gloves were used for the procedure. Local anesthesia was  provided with 1% Lidocaine. Previous imaging reviewed. Preliminary ultrasound performed. Left supraclavicular adenopathy localized. Overlying skin marked. Under sterile conditions and local anesthesia, an 18 gauge core biopsy was advanced to the left supraclavicular adenopathy. Several 18 gauge core biopsies obtained. Samples were placed on a saline moist Telfa. No immediate complication.  Patient tolerated the biopsy well. FINDINGS: Imaging confirms needle placement to the left supraclavicular adenopathy for core biopsy IMPRESSION: Successful ultrasound left supraclavicular adenopathy 18 gauge core biopsy Electronically Signed   By: Jerilynn Mages.  Shick M.D.   On: 08/19/2016 12:06    EKG:  No orders found for this or any previous visit.    Management plans discussed with the patient, family and they are in agreement.  CODE STATUS:     Code Status Orders        Start     Ordered   08/17/16 2313  Full code  Continuous     08/17/16 2312    Code Status History    Date Active Date Inactive Code Status Order ID Comments User Context   This patient has a current code status but no historical code status.      TOTAL TIME TAKING CARE OF THIS PATIENT: 45  minutes.   Note: This dictation was prepared with Dragon dictation along with smaller phrase technology. Any transcriptional errors that result from this process are unintentional.   @[facility] @  on 08/21/2016 at 11:31 AM  Between 7am to 6pm - Pager - [phone]  After 6pm go to [email or web address] - password [email or web address]  [facility]  Office  [phone]  CC: Primary care physician; No PCP Per Patient

## 2016-08-21 NOTE — Discharge Instructions (Signed)
Follow-up with primary care physician in a week Follow-up with oncology Dr. Seward Meth in a week

## 2016-08-21 NOTE — Progress Notes (Signed)
Pt's ride present for pt discharge; pt discharged via wheelchair by nursing to the visitor's entrance

## 2016-08-21 NOTE — Progress Notes (Signed)
Sheryl Mitchell   DOB:July 01, 1965   AJ#:[id number]    Subjective: Patient had a biopsy of her supraclavicular Lymph node the day before yesterday. Patient's cough is improving. Fevers are improving. She continues to be very anxious. She is currently on by mouth antibiotics.  Objective:  Vitals:   08/21/16 0900 08/21/16 0901  BP: (!) 149/95 (!) 149/95  Pulse: 98 99  Resp:    Temp: 98.4 F (36.9 C) 98.4 F (36.9 C)     Intake/Output Summary (Last 24 hours) at 08/21/16 1156 Last data filed at 08/21/16 1128  Gross per 24 hour  Intake           5407.2 ml  Output                0 ml  Net           5407.2 ml    GENERAL Alert, no distress and comfortable. Alone.  EYES: no pallor or icterus OROPHARYNX: no thrush or ulceration. NECK: supple, no masses felt LYMPH:  no palpable lymphadenopathy in the cervical, axillary or inguinal regions LUNGS: decreased breath sounds to auscultationRight side.  No wheeze or crackles HEART/CVS: regular rate & rhythm and no murmurs; No lower extremity edema ABDOMEN: abdomen soft, tender  on deep palpation. and normal bowel sounds Musculoskeletal:no cyanosis of digits and no clubbing  PSYCH: alert & oriented x 3 with fluent speech NEURO: no focal motor/sensory deficits SKIN:  no rashes or significant lesions   Labs:  Lab Results  Component Value Date   WBC 19.9 (H) 08/19/2016   HGB 9.5 (L) 08/19/2016   HCT 28.8 (L) 08/19/2016   MCV 89.2 08/19/2016   PLT 308 08/19/2016   NEUTROABS 19.4 (H) 08/17/2016    Lab Results  Component Value Date   NA 141 08/21/2016   K 4.0 08/21/2016   CL 111 08/21/2016   CO2 23 08/21/2016    Studies:  No results found.  Assessment & Plan:   51 year old female patient currently in the hospital for right sided pneumonia; incidental extensive adenopathy suspicious for lymphoma  # Generalized lymphadenopathy/lymphocytosis-above and below the diaphragm. Status post supraclavicular lymph node biopsy- awaiting results.  Flow cytometry pending.   # Community-acquired pneumonia/strep pneumonia positive blood cultures on antibiotics. Patient defervesced. Appreciate ID recommendations. Patient be discharged on oral antibiotics.  # Patient will follow-up with me in the clinic in [facility] on Tuesday, May 8th [patient lives in [place] with her sister-in-law; [phone]; sister-in-law [phone]  Discussed with Dr. Margaretmary Eddy.   Cammie Sickle, MD 08/21/2016  11:56 AM

## 2016-08-21 NOTE — Progress Notes (Signed)
MD order received to discharge pt home today; Care Management previously established Rx service for pt through [facility] for antibiotic and Good Rx coupon for Celexa; verbally reviewed AVS with pt; no questions voiced at this time; pt's discharge pending search for pt's [place] Driver's License previously given to ED personnel and pt verbalized that they were never returned to her

## 2016-08-22 LAB — CULTURE, BLOOD (ROUTINE X 2): Culture: NO GROWTH

## 2016-08-25 ENCOUNTER — Encounter: Payer: Self-pay | Admitting: Internal Medicine

## 2016-08-26 ENCOUNTER — Encounter: Payer: Self-pay | Admitting: Internal Medicine

## 2016-08-26 ENCOUNTER — Inpatient Hospital Stay: Payer: Self-pay | Attending: Internal Medicine | Admitting: Internal Medicine

## 2016-08-26 DIAGNOSIS — C8300 Small cell B-cell lymphoma, unspecified site: Secondary | ICD-10-CM

## 2016-08-26 DIAGNOSIS — C9110 Chronic lymphocytic leukemia of B-cell type not having achieved remission: Secondary | ICD-10-CM | POA: Insufficient documentation

## 2016-08-26 DIAGNOSIS — J189 Pneumonia, unspecified organism: Secondary | ICD-10-CM | POA: Insufficient documentation

## 2016-08-26 DIAGNOSIS — Z87891 Personal history of nicotine dependence: Secondary | ICD-10-CM | POA: Insufficient documentation

## 2016-08-26 DIAGNOSIS — Z79899 Other long term (current) drug therapy: Secondary | ICD-10-CM | POA: Insufficient documentation

## 2016-08-26 NOTE — Progress Notes (Signed)
Pt presents to clinic today for HP follow-up. Newly dx CLL.  Patient states that she has not started the Celexa due to fear of potential side effects.  Pt crying in exam room. pt states that she is very anxious about today's bx. She states, "I didn't need eat or drink anything all day in worry about what I am going to find out today."  pt refused to complete new patient paperwork today and DPR.  Pt verbally reconciled med list with RN.  Pt states that she has no support system. Pt currently living with a "friend" in [place], [place].  She states that this "friend is not supportive and my living arrangements are only temporary." Attempts with open ended questions were made to explore patient's home situation and barriers to care.  Pt expressed hopelessness.  Pt declined further resources and discussion on the matter.  Comfort measures were provided to patient- pt offered ginger ale and snacks. Pt c/o nausea r/t to anxiety.  After md visit, Patient refused further follow-up in clinic today. She states that she will contact our office should she desire future f/u.

## 2016-08-26 NOTE — Progress Notes (Signed)
[facility] CONSULT NOTE  Patient Care Team: Patient, No Pcp Per as PCP - General (General Practice)  CHIEF COMPLAINTS/PURPOSE OF CONSULTATION: Lymphoma/SLL  #  Oncology History   # April 2018-  SLL/CLL [flow; supraclav LN Bx-]   # April 2018RUL Pneumonia     Malignant lymphoma, small lymphocytic ([place])     HISTORY OF PRESENTING ILLNESS:  Sheryl Mitchell 51 y.o.  female who was recently seen in in the hospital when she was admitted for community-acquired pneumonia of the right lung. While in the hospital patient noted to have extensive adenopathy above and below the diaphragm/ also leukocytosis predominant lymphocytosis. Patient had a biopsy of the neck lymph node; and also flow cytometry of the peripheral blood. She is here to review the results of the workup/biopsy.  Patient states her cough is improved. Denies any hemoptysis. Shortness of breath improved. She is extremely anxious. No nausea no vomiting. No fevers or chills.  ROS: A complete 10 point review of system is done which is negative except mentioned above in history of present illness  MEDICAL HISTORY:  No past medical history on file.  SURGICAL HISTORY: Past Surgical History:  Procedure Laterality Date  . none      SOCIAL HISTORY: Patient  Used to live in [place] by herself. Currently lives with sister-in-law/ in [place]. She has no children. Her husband passed away 2 years ago. History of smoking quit approximately 8 years ago. Occasional alcohol. She currently works at a Engineer, manufacturing. She is otherwise fairly independent a daily activities Social History   Social History  . Marital status: Widowed    Spouse name: N/A  . Number of children: N/A  . Years of education: N/A   Occupational History  .  [employer]   Social History Main Topics  . Smoking status: Former Research scientist (life sciences)  . Smokeless tobacco: Never Used  . Alcohol use 0.6 oz/week    1 Glasses of wine per week  . Drug use: No  .  Sexual activity: Yes   Other Topics Concern  . Not on file   Social History Narrative  . No narrative on file    FAMILY HISTORY: Family History  Problem Relation Age of Onset  . Heart disease Mother   . Cancer Father     ALLERGIES:  has No Known Allergies.  MEDICATIONS:  Current Outpatient Prescriptions  Medication Sig Dispense Refill  . amoxicillin (AMOXIL) 500 MG capsule Take 1 capsule (500 mg total) by mouth 3 (three) times daily. 42 capsule 0  . acetaminophen (TYLENOL) 325 MG tablet Take 2 tablets (650 mg total) by mouth every 6 (six) hours as needed for mild pain (or Fever >/= 101). (Patient not taking: Reported on 08/26/2016)    . citalopram (CELEXA) 10 MG tablet Take 1 tablet (10 mg total) by mouth daily. (Patient not taking: Reported on 08/26/2016) 30 tablet 0  . feeding supplement, ENSURE ENLIVE, (ENSURE ENLIVE) LIQD Take 237 mLs by mouth daily. (Patient not taking: Reported on 08/26/2016) 30 Bottle 1  . guaiFENesin-dextromethorphan (ROBITUSSIN DM) 100-10 MG/5ML syrup Take 5 mLs by mouth every 4 (four) hours as needed for cough (chest congestion). (Patient not taking: Reported on 08/26/2016) 118 mL 0  . senna-docusate (SENOKOT-S) 8.6-50 MG tablet Take 1 tablet by mouth at bedtime as needed for mild constipation. (Patient not taking: Reported on 08/26/2016)     No current facility-administered medications for this visit.       Marland Kitchen  PHYSICAL EXAMINATION: ECOG PERFORMANCE  STATUS: 0 - Asymptomatic  Vitals:   08/26/16 1439  BP: (!) 156/104  Pulse: (!) 106  Resp: 20  Temp: 98.5 F (36.9 C)   Filed Weights   08/26/16 1439  Weight: 216 lb 0.8 oz (98 kg)    GENERAL: Well-nourished well-developed; Alert, no distress and comfortable.   Obese. She is alone. EYES: no pallor or icterus OROPHARYNX: no thrush or ulceration. NECK: supple, no masses felt LYMPH:  no palpable lymphadenopathy in the axillary or inguinal regions. Mild shotty lymph nodes noted in the neck  bilaterally. LUNGS: decreased breath sounds to auscultation at bases and  No wheeze or crackles HEART/CVS: regular rate & rhythm and no murmurs; No lower extremity edema ABDOMEN: abdomen soft, non-tender and normal bowel sounds Musculoskeletal:no cyanosis of digits and no clubbing  PSYCH: alert & oriented x 3 with fluent speech; extremely anxious. NEURO: no focal motor/sensory deficits SKIN:  no rashes or significant lesions  LABORATORY DATA:  I have reviewed the data as listed Lab Results  Component Value Date   WBC 19.9 (H) 08/19/2016   HGB 9.5 (L) 08/19/2016   HCT 28.8 (L) 08/19/2016   MCV 89.2 08/19/2016   PLT 308 08/19/2016    Recent Labs  08/17/16 1822 08/18/16 0454  08/19/16 0400 08/20/16 0356 08/21/16 0459  NA 135 139  < > 142 141 141  K 2.8* 2.9*  < > 4.2 3.6 4.0  CL 100* 108  < > 113* 113* 111  CO2 23 24  < > 23 23 23   GLUCOSE 126* 97  < > 99 104* 92  BUN 11 8  < > 7 7 8   CREATININE 0.71 0.76  < > 0.61 0.51 0.51  CALCIUM 8.7* 7.5*  < > 8.0* 7.8* 8.0*  GFRNONAA >60 >60  < > >60 >60 >60  GFRAA >60 >60  < > >60 >60 >60  PROT 6.9 5.3*  --   --   --   --   ALBUMIN 2.5* 2.0*  --   --   --   --   AST 59* 33  --   --   --   --   ALT 66* 47  --   --   --   --   ALKPHOS 140* 113  --   --   --   --   BILITOT 0.6 0.3  --   --   --   --   BILIDIR  --  0.2  --   --   --   --   IBILI  --  0.1*  --   --   --   --   < > = values in this interval not displayed.  RADIOGRAPHIC STUDIES: I have personally reviewed the radiological images as listed and agreed with the findings in the report. Dg Chest 2 View  Result Date: 08/17/2016 CLINICAL DATA:  Fever and cough EXAM: CHEST  2 VIEW COMPARISON:  None. FINDINGS: Cardiac shadow is within normal limits. Right upper lobe infiltrate is seen. The lungs are otherwise clear. No pneumothorax or effusion is noted. No bony abnormality is seen. IMPRESSION: Right upper lobe pneumonia. Followup PA and lateral chest X-ray is recommended in 3-4  weeks following trial of antibiotic therapy to ensure resolution and exclude underlying malignancy. Electronically Signed   By: Inez Catalina M.D.   On: 08/17/2016 19:24   Ct Soft Tissue Neck W Contrast  Result Date: 08/18/2016 CLINICAL DATA:  51 year old female with fever and abdominal  pain. Extensive abdominal and pelvis lymphadenopathy suspicious for lymphoproliferative disorder or other neoplastic process. EXAM: CT NECK WITH CONTRAST TECHNIQUE: Multidetector CT imaging of the neck was performed using the standard protocol following the bolus administration of intravenous contrast. CONTRAST:  66mL ISOVUE-300 IOPAMIDOL (ISOVUE-300) INJECTION 61% in conjunction with contrast enhanced imaging of the chest reported separately. COMPARISON:  Chest CT today reported separately. CT Abdomen and Pelvis 08/17/2016 FINDINGS: Pharynx and larynx: Larynx and pharynx soft tissue contours are within normal limits. Negative parapharyngeal spaces. Negative retropharyngeal space aside from small but conspicuous retropharyngeal lymph nodes (e.g. Series 3, image 28 on the right). Salivary glands: Negative sublingual space. Negative submandibular glands and parotid glands. Bilateral parotid space lymph nodes are small but mildly increased in number. Thyroid: Subcentimeter hypodense nodule in the left lobe does not meet consensus criteria for ultrasound follow-up. Negative isthmus and right lobe. Lymph nodes: Increased number of primarily subcentimeter lymph nodes throughout the bilateral neck nodal stations. The largest nodes at the bilateral level IIa and level 1b stations measure up to 10 mm short axis but have maintained fatty hila. There are also 10-11 mm short axis lymph nodes at the left level 4 station. No cystic or necrotic nodes identified. Vascular: Major vascular structures in the neck and at the skullbase are patent. Limited intracranial: Negative. Visualized orbits: Negative. Mastoids and visualized paranasal sinuses:  Small left maxillary mucous retention cyst. Minimal anterior right ethmoid sinus mucosal thickening. Otherwise clear. Skeleton: No acute or suspicious osseous lesion in the neck. Upper chest: Chest CT today is reported separately. IMPRESSION: 1. Increased number of predominantly subcentimeter lymph nodes throughout the bilateral neck nodal stations. Favor Leukemia. Other lymphoproliferative disorder, metastatic process, or infectious/reactive nodes are less likely. 2. Abnormal chest CT findings today are reported separately. Electronically Signed   By: Genevie Ann M.D.   On: 08/18/2016 11:58   Ct Chest W Contrast  Result Date: 08/18/2016 CLINICAL DATA:  Community acquired pneumonia. Fever. Abdominal discomfort and cough. EXAM: CT CHEST WITH CONTRAST TECHNIQUE: Multidetector CT imaging of the chest was performed during intravenous contrast administration. CONTRAST:  56mL ISOVUE-300 IOPAMIDOL (ISOVUE-300) INJECTION 61% COMPARISON:  None. FINDINGS: Cardiovascular: Normal heart size.  No pericardial effusion. Mediastinum/Nodes: The trachea appears patent and is midline. Unremarkable appearance of the esophagus. Enlarged mediastinal lymph nodes identified. Index right paratracheal node measures 1.4 cm, image 48 of series 3. Index sub- carinal lymph node measures 1.6 cm, image 67 of series 3. Left hilar lymph node measures 11 mm, image 67 of series 3. Prominent bilateral axillary lymph nodes identified. Index right axillary node measures 1.6 cm, image 39 of series 3. Index left axillary lymph node measures 1 cm, image 19 of series 3. Lungs/Pleura: Small right pleural effusion. Dense interstitial and airspace consolidation involving the right upper lobe identified. Upper Abdomen: No acute abnormality. Musculoskeletal: No chest wall abnormality. No acute or significant osseous findings. IMPRESSION: 1. Dense right upper lobe airspace consolidation is identified compatible with the clinical history of community acquired  pneumonia. 2. Enlarged bilateral axillary, mediastinal and sub- carinal lymph nodes. Findings may be compatible with clinically suspected lymphoma. Correlation with tissue sampling advise. 3. Small right pleural effusion. Electronically Signed   By: Kerby Moors M.D.   On: 08/18/2016 11:52   Ct Abdomen Pelvis W Contrast  Result Date: 08/17/2016 CLINICAL DATA:  Fever, diarrhea, fatigue and anorexia for 6 days. EXAM: CT ABDOMEN AND PELVIS WITH CONTRAST TECHNIQUE: Multidetector CT imaging of the abdomen and pelvis was performed using the  standard protocol following bolus administration of intravenous contrast. CONTRAST:  145mL ISOVUE-300 IOPAMIDOL (ISOVUE-300) INJECTION 61% COMPARISON:  None. FINDINGS: Lower chest: Consolidation in the right middle lobe base, continuing above the upper limit of this study. Inferior mediastinal adenopathy measuring at least 2 cm short axis just to the right of the distal thoracic esophagus. Hepatobiliary: No focal liver abnormality is seen. No gallstones, gallbladder wall thickening, or biliary dilatation. Pancreas: Unremarkable. No pancreatic ductal dilatation or surrounding inflammatory changes. Spleen: No focal splenic lesions. The spleen is mildly enlarged, measuring 9.0 by 1.5 x 1.4 cm. Adrenals/Urinary Tract: Adrenal glands are unremarkable. Kidneys are normal, without renal calculi, focal lesion, or hydronephrosis. Bladder is unremarkable. Stomach/Bowel: Stomach is within normal limits. Appendix appears normal. No evidence of bowel wall thickening, distention, or inflammatory changes. Vascular/Lymphatic: The abdominal aorta is normal in caliber with mild atherosclerotic calcification. There is extensive adenopathy, with greatest involvement in the retroperitoneum. There is a 10 mm short axis aortocaval node on series 2, image 32 and a 1.4 cm celiac node to the left of midline on series 2, image 32. There is a 12 mm retrocaval node on series 2 image 48. There are multiple  para-aortic nodes measuring up to 1.2 cm. There is a left common iliac node measuring 1.3 cm on series 2, image 60. There is a 1.5 cm node adjacent to the left iliac bifurcation on series 2, image 66. There are multiple iliac nodes throughout the pelvis. There are bilateral obturator internus nodes measuring up to 1.6 cm on series 2, image 80. Reproductive: Uterus and bilateral adnexa are unremarkable. Other: No focal inflammation.  No ascites. Musculoskeletal: No acute or significant osseous findings. IMPRESSION: 1. Extensive adenopathy throughout the abdomen and pelvis. This is concerning for a neoplastic process such as lymphoma. 2. Mild splenomegaly.  No focal splenic lesion. 3. Included portions of the lower chest demonstrate inferior mediastinal adenopathy and right middle lobe base lung consolidation. Consider chest CT for evaluation of extent of disease. 4. Consider tissue sampling for diagnosis. If there are no palpable nodes, many of the abdomen/pelvic nodes are amenable to CT-guided biopsy. 5. These results will be called to the ordering clinician or representative by the Radiologist Assistant, and communication documented in the PACS or zVision Dashboard. Electronically Signed   By: Andreas Newport M.D.   On: 08/17/2016 21:28   US Biopsy  Result Date: 08/19/2016 INDICATION: Diffuse adenopathy EXAM: ULTRASOUND GUIDED CORE BIOPSY OF LEFT SUPRACLAVICULAR ADENOPATHY MEDICATIONS: 1% LIDOCAINE LOCALLY ANESTHESIA/SEDATION: Versed 1.0mg  IV; Fentanyl 63mcg IV; Moderate Sedation Time:  11 MINUTES The patient was continuously monitored during the procedure by the interventional radiology nurse under my direct supervision. FLUOROSCOPY TIME:  Fluoroscopy Time: None. COMPLICATIONS: None immediate. PROCEDURE: The procedure, risks, benefits, and alternatives were explained to the patient. Questions regarding the procedure were encouraged and answered. The patient understands and consents to the procedure. The left  neck was prepped with chloroprep in a sterile fashion, and a sterile drape was applied covering the operative field. A sterile gown and sterile gloves were used for the procedure. Local anesthesia was provided with 1% Lidocaine. Previous imaging reviewed. Preliminary ultrasound performed. Left supraclavicular adenopathy localized. Overlying skin marked. Under sterile conditions and local anesthesia, an 18 gauge core biopsy was advanced to the left supraclavicular adenopathy. Several 18 gauge core biopsies obtained. Samples were placed on a saline moist Telfa. No immediate complication.  Patient tolerated the biopsy well. FINDINGS: Imaging confirms needle placement to the left supraclavicular adenopathy for core  biopsy IMPRESSION: Successful ultrasound left supraclavicular adenopathy 18 gauge core biopsy Electronically Signed   By: Jerilynn Mages.  Shick M.D.   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 I would recommend checking fish panel/ and I GVH mutation status.   # Long discussion the patient regarding natural history of the disease/and the treatment options including chemotherapy; antibody therapy; and also targeted therapy with ibrutinib. Also discussed that we do not treat leukemia unless she gets symptomatic. Also discussed the symptoms expected of the leukemia; and also the fact that patient usually lived years; although the disease is incurable.  # Community for pneumonia- status post IV antibiotics; Recommend she finished amoxicillin 500 3 times a day for a total of 3 weeks. Significant improvement noted. Recommend checking for quantitative immunoglobulins.   # After a long discussion- patient at this time declines further workup as discussed above "a lot to digest" at this time. She did not want to make any return appointment at this time. She wants to think about this; and  call for further appointments.  All questions were answered. The patient knows to call the clinic with any problems, questions or concerns. She understands that postponing- as discussed above might be detrimental to her care.     Cammie Sickle, MD 08/26/2016 3:15 PM

## 2016-08-26 NOTE — Assessment & Plan Note (Signed)
SLL/CLL- based on peripheral blood flow cytometry/ supraclavicular lymph node biopsy. I would recommend checking fish panel/ and I GVH mutation status.   # Long discussion the patient regarding natural history of the disease/and the treatment options including chemotherapy; antibody therapy; and also targeted therapy with ibrutinib. Also discussed that we do not treat leukemia unless she gets symptomatic. Also discussed the symptoms expected of the leukemia; and also the fact that patient usually lived years; although the disease is incurable.  # Community for pneumonia- status post IV antibiotics; Recommend she finished amoxicillin 500 3 times a day for a total of 3 weeks. Significant improvement noted. Recommend checking for quantitative immunoglobulins.   # After a long discussion- patient at this time declines further workup as discussed above "a lot to digest" at this time. She did not want to make any return appointment at this time. She wants to think about this; and call for further appointments.

## 2018-10-14 IMAGING — CT CT NECK W/ CM
4 of 5 series · 16 of 33 positions shown, 18 images · IV contrast (iopamidol)
Comparison: Chest CT today reported separately. CT Abdomen and
Pelvis 08/17/2016

CLINICAL DATA: 51-year-old female with fever and abdominal pain.
Extensive abdominal and pelvis lymphadenopathy suspicious for
lymphoproliferative disorder or other neoplastic process.

EXAM:
CT NECK WITH CONTRAST
TECHNIQUE: Multidetector CT imaging of the neck was performed using the
standard protocol following the bolus administration of intravenous
contrast.
CONTRAST:  75mL Y1LLW5-FWW IOPAMIDOL (Y1LLW5-FWW) INJECTION 61% in
conjunction with contrast enhanced imaging of the chest reported
separately.

[Series 3: axial neck · axial · 0.55mm/px · z∈[+877,+1021]mm · 4 of 121 slices shown]
[im 25/121  bone]
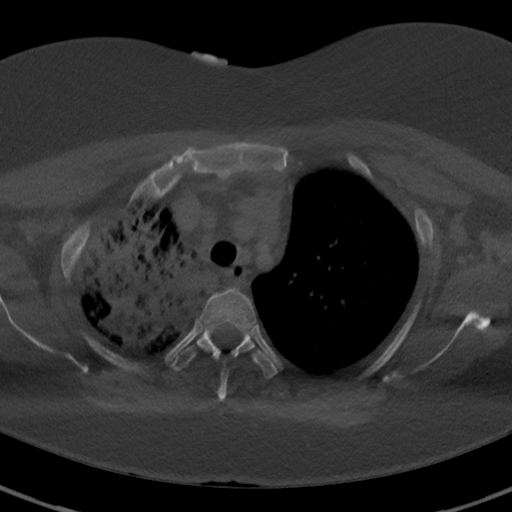
[im 49/121  bone]
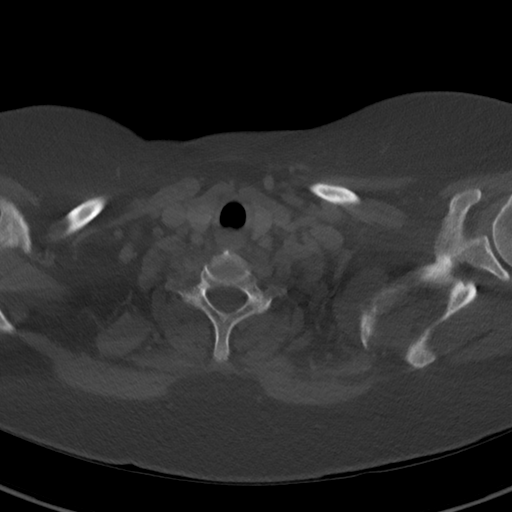
[im 73/121  bone]
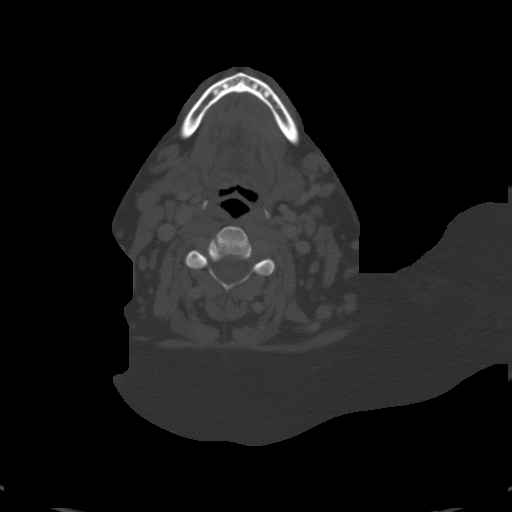
[im 97/121  bone]
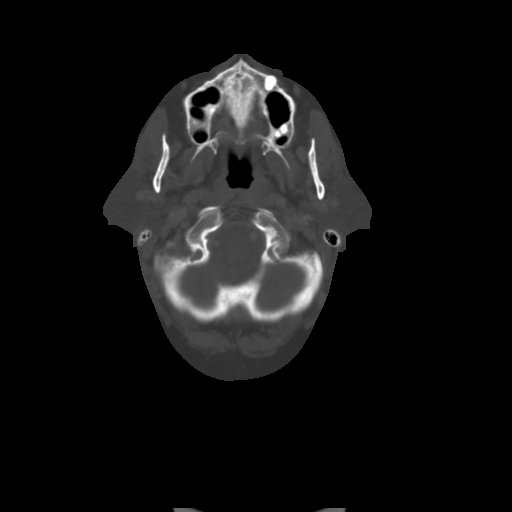

[Series 7: sag neck · sagittal · 0.50mm/px · 5 of 113 slices shown, 6 images]
[im 38/113  bone]
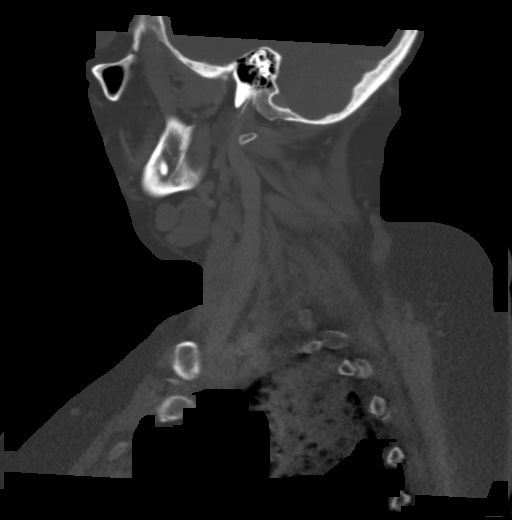
[im 47/113  bone]
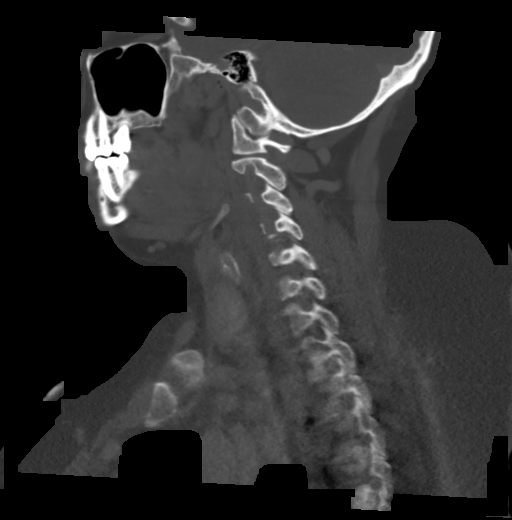
[im 57/113  soft-tissue]
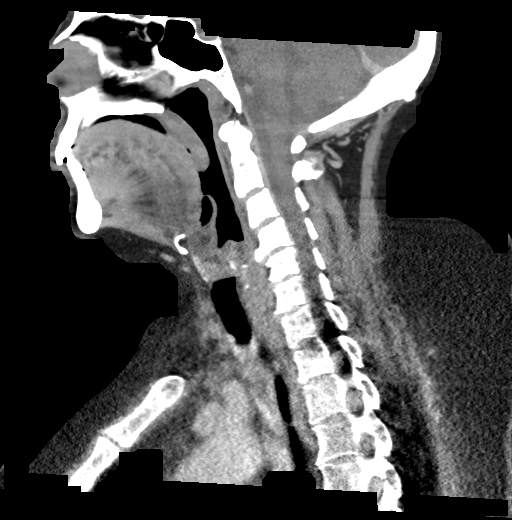
[im 57/113  bone]
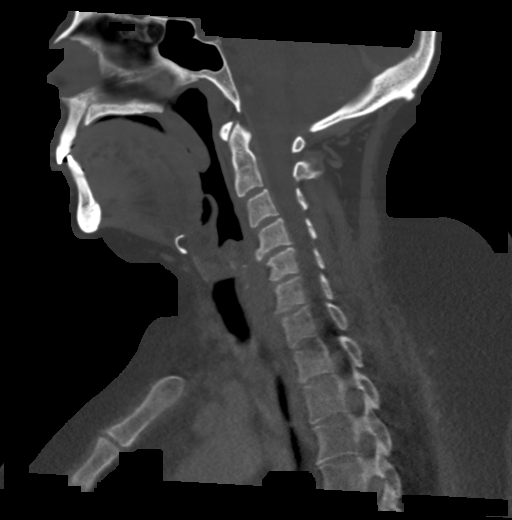
[im 66/113  bone]
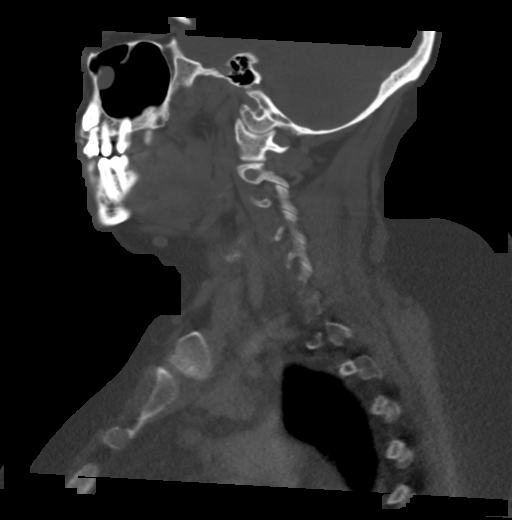
[im 75/113  bone]
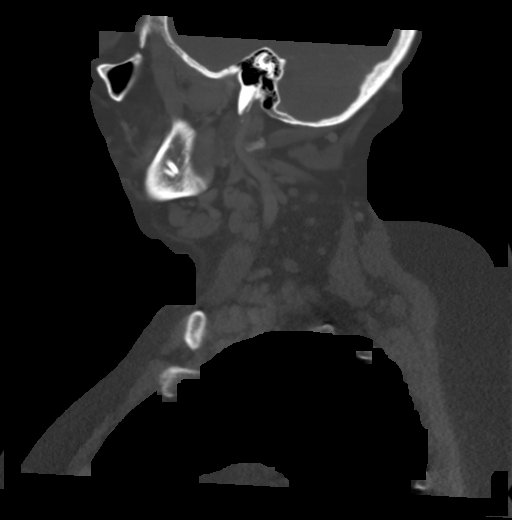

[Series 8: cor neck · coronal · 0.51mm/px · 3 of 121 slices shown]
[im 25/121  bone]
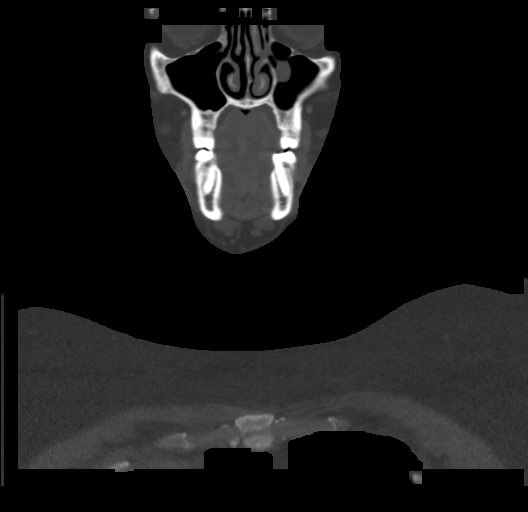
[im 49/121  bone]
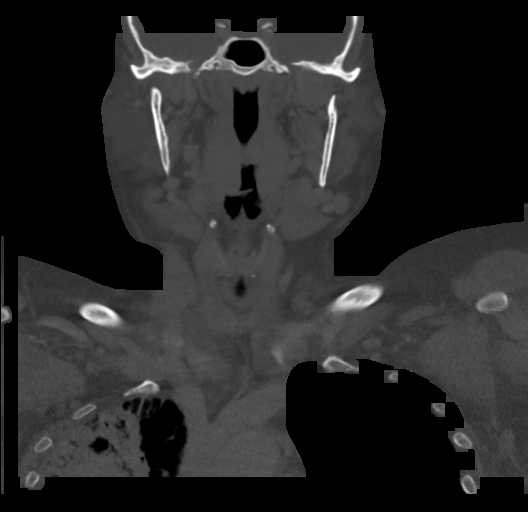
[im 73/121  bone]
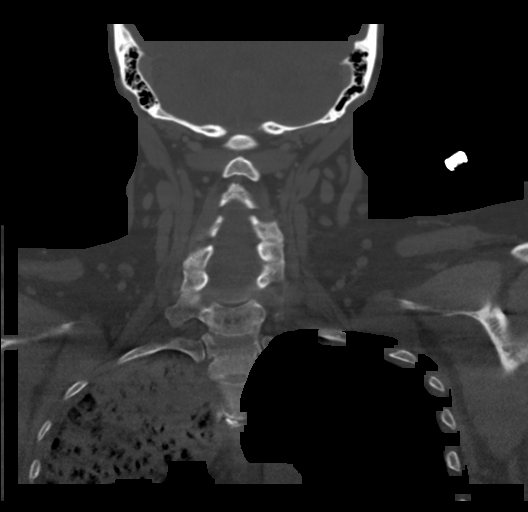

[Series 9: orthogonal ax · axial · 0.43mm/px · z∈[+852,+1011]mm · 4 of 134 slices shown, 5 images]
[im 27/134  soft-tissue]
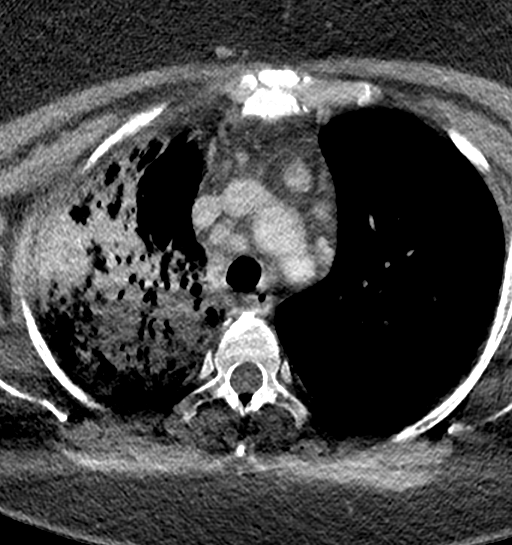
[im 27/134  bone]
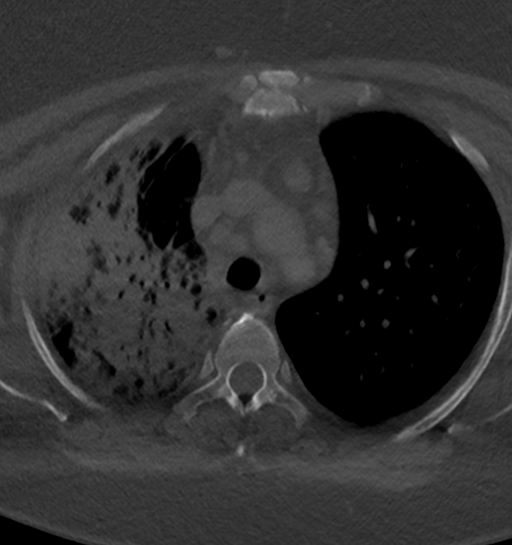
[im 54/134  bone]
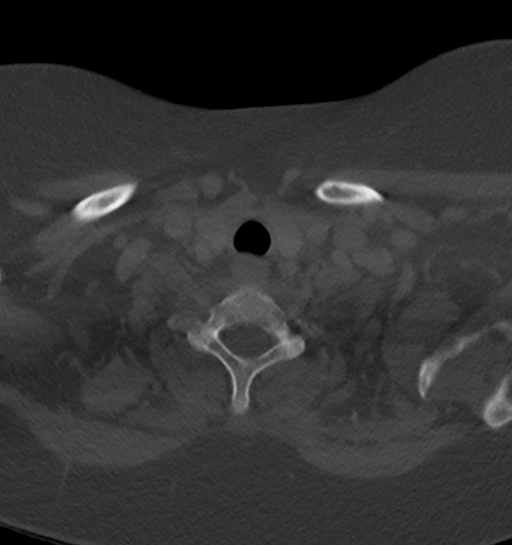
[im 80/134  bone]
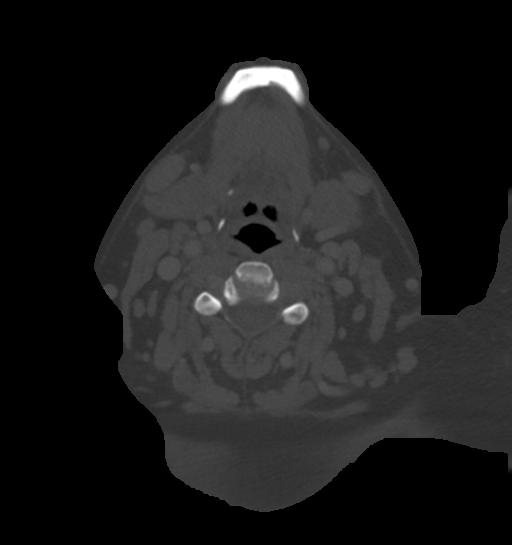
[im 107/134  bone]
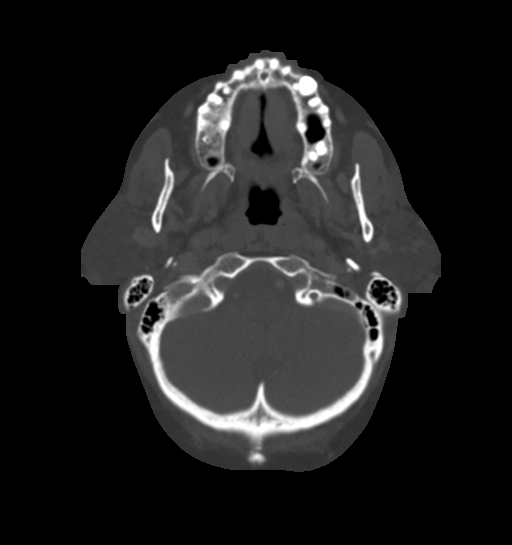

[16 of 33 positions shown; findings below may reference images not displayed]

FINDINGS: Pharynx and larynx: Larynx and pharynx soft tissue contours are
within normal limits. Negative parapharyngeal spaces. Negative
retropharyngeal space aside from small but conspicuous
retropharyngeal lymph nodes (e.g. Series 3, image 28 on the right).

Salivary glands: Negative sublingual space. Negative submandibular
glands and parotid glands. Bilateral parotid space lymph nodes are
small but mildly increased in number.

Thyroid: Subcentimeter hypodense nodule in the left lobe does not
meet consensus criteria for ultrasound follow-up. Negative isthmus
and right lobe.

Lymph nodes: Increased number of primarily subcentimeter lymph nodes
throughout the bilateral neck nodal stations. The largest nodes at
the bilateral level IIa and level 1b stations measure up to 10 mm
short axis but have maintained fatty hila. There are also 10-11 mm
short axis lymph nodes at the left level 4 station. No cystic or
necrotic nodes identified.

Vascular: Major vascular structures in the neck and at the skullbase
are patent.

Limited intracranial: Negative.

Visualized orbits: Negative.

Mastoids and visualized paranasal sinuses: Small left maxillary
mucous retention cyst. Minimal anterior right ethmoid sinus mucosal
thickening. Otherwise clear.

Skeleton: No acute or suspicious osseous lesion in the neck.

Upper chest: Chest CT today is reported separately.
IMPRESSION: 1. Increased number of predominantly subcentimeter lymph nodes
throughout the bilateral neck nodal stations. Favor Leukemia. Other
lymphoproliferative disorder, metastatic process, or
infectious/reactive nodes are less likely.
2. Abnormal chest CT findings today are reported separately.
